# Patient Record
Sex: Male | Born: 1995 | Race: Black or African American | Hispanic: No | Smoking: Current every day smoker
Health system: Southern US, Community
[De-identification: ages and names within clinical notes are randomized; demographics above are authoritative.]

---
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
## 2015-03-19 ENCOUNTER — Encounter (HOSPITAL_COMMUNITY): Payer: Self-pay | Admitting: Emergency Medicine

## 2015-03-19 ENCOUNTER — Emergency Department (INDEPENDENT_AMBULATORY_CARE_PROVIDER_SITE_OTHER)
Admission: EM | Admit: 2015-03-19 | Discharge: 2015-03-19 | Disposition: A | Discharge status: Home / Self Care | Payer: Self-pay | Source: Home / Self Care | Attending: Family Medicine | Admitting: Family Medicine

## 2015-03-19 DIAGNOSIS — K2970 Gastritis, unspecified, without bleeding: Secondary | ICD-10-CM

## 2015-03-19 DIAGNOSIS — R1013 Epigastric pain: Secondary | ICD-10-CM

## 2015-03-19 MED ORDER — OMEPRAZOLE 40 MG PO CPDR: 40.0000 mg | DELAYED_RELEASE_CAPSULE | Freq: Every day | ORAL | Status: DC

## 2015-03-19 MED ORDER — GI COCKTAIL ~~LOC~~
ORAL | Status: AC
  Filled 2015-03-19: qty 30

## 2015-03-19 MED ORDER — SUCRALFATE 1 G PO TABS: 1.0000 g | ORAL_TABLET | Freq: Three times a day (TID) | ORAL | Status: DC

## 2015-03-19 MED ORDER — GI COCKTAIL ~~LOC~~
30.0000 mL | Freq: Once | ORAL | Status: AC
  Administered 2015-03-19: 30 mL via ORAL

## 2015-03-19 NOTE — ED Provider Notes (Signed)
Cory SicksGeorge Abernethy Huynh is a 19 y.o. male who presents to Urgent Care today for abdominal pain and vomiting present for one week. Symptoms worsened yesterday. Symptoms are consistent with prior episodes of gastritis. He denies any blood in the vomit. No diarrhea. No fevers or chills. No treatment tried yet.   History reviewed. No pertinent past medical history. History reviewed. No pertinent past surgical history. History  Substance Use Topics  . Smoking status: Current Every Day Smoker -- 1.00 packs/day    Types: Cigarettes  . Smokeless tobacco: Not on file  . Alcohol Use: No   ROS as above Medications: No current facility-administered medications for this encounter.   Current Outpatient Prescriptions  Medication Sig Dispense Refill  . benzonatate (TESSALON) 100 MG capsule Take 1 capsule (100 mg total) by mouth 2 (two) times daily as needed for cough. 20 capsule 0  . omeprazole (PRILOSEC) 40 MG capsule Take 1 capsule (40 mg total) by mouth daily. 30 capsule 1  . oseltamivir (TAMIFLU) 75 MG capsule Take 1 capsule (75 mg total) by mouth every 12 (twelve) hours. 10 capsule 0  . Phenylephrine-DM-GG-APAP (TYLENOL COLD/FLU SEVERE) 5-10-200-325 MG TABS Take by mouth.    . sucralfate (CARAFATE) 1 G tablet Take 1 tablet (1 g total) by mouth 4 (four) times daily -  with meals and at bedtime. 60 tablet 0   No Known Allergies   Exam:  BP 128/71 mmHg  Pulse 80  Temp(Src) 98.4 F (36.9 C) (Oral)  Resp 16  SpO2 99% Gen: Well NAD HEENT: EOMI,  MMM Lungs: Normal work of breathing. CTABL Heart: RRR no MRG Abd: NABS, Soft. Nondistended, mildly tender epigastric area no rebound or guarding Exts: Brisk capillary refill, warm and well perfused.   Patient was given a GI cocktail and felt better  No results found for this or any previous visit (from the past 24 hour(s)). No results found.  Assessment and Plan: 19 y.o. male with gastritis. Treat with omeprazole and Carafate. Follow-up with  PCP.  Discussed warning signs or symptoms. Please see discharge instructions. Patient expresses understanding.     Rodolph BongEvan S Corey, MD 03/19/15 [phone]

## 2015-03-19 NOTE — Discharge Instructions (Signed)
Thank you for coming in today. °If your belly pain worsens, or you have high fever, bad vomiting, blood in your stool or black tarry stool go to the Emergency Room.  ° °Gastritis, Adult °Gastritis is soreness and swelling (inflammation) of the lining of the stomach. Gastritis can develop as a sudden onset (acute) or long-term (chronic) condition. If gastritis is not treated, it can lead to stomach bleeding and ulcers. °CAUSES  °Gastritis occurs when the stomach lining is weak or damaged. Digestive juices from the stomach then inflame the weakened stomach lining. The stomach lining may be weak or damaged due to viral or bacterial infections. One common bacterial infection is the Helicobacter pylori infection. Gastritis can also result from excessive alcohol consumption, taking certain medicines, or having too much acid in the stomach.  °SYMPTOMS  °In some cases, there are no symptoms. When symptoms are present, they may include: °· Pain or a burning sensation in the upper abdomen. °· Nausea. °· Vomiting. °· An uncomfortable feeling of fullness after eating. °DIAGNOSIS  °Your caregiver may suspect you have gastritis based on your symptoms and a physical exam. To determine the cause of your gastritis, your caregiver may perform the following: °· Blood or stool tests to check for the H pylori bacterium. °· Gastroscopy. A thin, flexible tube (endoscope) is passed down the esophagus and into the stomach. The endoscope has a light and camera on the end. Your caregiver uses the endoscope to view the inside of the stomach. °· Taking a tissue sample (biopsy) from the stomach to examine under a microscope. °TREATMENT  °Depending on the cause of your gastritis, medicines may be prescribed. If you have a bacterial infection, such as an H pylori infection, antibiotics may be given. If your gastritis is caused by too much acid in the stomach, H2 blockers or antacids may be given. Your caregiver may recommend that you stop taking  aspirin, ibuprofen, or other nonsteroidal anti-inflammatory drugs (NSAIDs). °HOME CARE INSTRUCTIONS °· Only take over-the-counter or prescription medicines as directed by your caregiver. °· If you were given antibiotic medicines, take them as directed. Finish them even if you start to feel better. °· Drink enough fluids to keep your urine clear or pale yellow. °· Avoid foods and drinks that make your symptoms worse, such as: °¨ Caffeine or alcoholic drinks. °¨ Chocolate. °¨ Peppermint or mint flavorings. °¨ Garlic and onions. °¨ Spicy foods. °¨ Citrus fruits, such as oranges, lemons, or limes. °¨ Tomato-based foods such as sauce, chili, salsa, and pizza. °¨ Fried and fatty foods. °· Eat small, frequent meals instead of large meals. °SEEK IMMEDIATE MEDICAL CARE IF:  °· You have black or dark red stools. °· You vomit blood or material that looks like coffee grounds. °· You are unable to keep fluids down. °· Your abdominal pain gets worse. °· You have a fever. °· You do not feel better after 1 week. °· You have any other questions or concerns. °MAKE SURE YOU: °· Understand these instructions. °· Will watch your condition. °· Will get help right away if you are not doing well or get worse. °Document Released: 10/18/2001 Document Revised: 04/24/2012 Document Reviewed: 12/07/2011 °ExitCare® Patient Information ©2015 [employer] LLC. This information is not intended to replace advice given to you by your health care provider. Make sure you discuss any questions you have with your health care provider. ° °

## 2015-03-19 NOTE — ED Notes (Signed)
C/o abd pain States he has hx of stomach ulcers States he has been vomiting Denies any diarrhea

## 2015-04-08 ENCOUNTER — Encounter (HOSPITAL_COMMUNITY): Payer: Self-pay | Admitting: *Deleted

## 2015-04-08 ENCOUNTER — Emergency Department (HOSPITAL_COMMUNITY)
Admission: EM | Admit: 2015-04-08 | Discharge: 2015-04-08 | Disposition: A | Discharge status: Home / Self Care | Payer: Self-pay | Attending: Emergency Medicine | Admitting: Emergency Medicine

## 2015-04-08 DIAGNOSIS — L989 Disorder of the skin and subcutaneous tissue, unspecified: Secondary | ICD-10-CM | POA: Insufficient documentation

## 2015-04-08 DIAGNOSIS — L259 Unspecified contact dermatitis, unspecified cause: Secondary | ICD-10-CM | POA: Insufficient documentation

## 2015-04-08 DIAGNOSIS — R202 Paresthesia of skin: Secondary | ICD-10-CM | POA: Insufficient documentation

## 2015-04-08 DIAGNOSIS — Z79899 Other long term (current) drug therapy: Secondary | ICD-10-CM | POA: Insufficient documentation

## 2015-04-08 DIAGNOSIS — IMO0002 Reserved for concepts with insufficient information to code with codable children: Secondary | ICD-10-CM

## 2015-04-08 DIAGNOSIS — L139 Bullous disorder, unspecified: Secondary | ICD-10-CM | POA: Insufficient documentation

## 2015-04-08 DIAGNOSIS — Z720 Tobacco use: Secondary | ICD-10-CM | POA: Insufficient documentation

## 2015-04-08 MED ORDER — TRIAMCINOLONE ACETONIDE 0.1 % EX CREA: 1.0000 "application " | TOPICAL_CREAM | Freq: Three times a day (TID) | CUTANEOUS | Status: DC

## 2015-04-08 NOTE — ED Notes (Signed)
Provided teaching for dressing change and extra supplies.

## 2015-04-08 NOTE — ED Notes (Signed)
Pt in c/o rash and blisters to his right ankle and back of his foot, pt states it was itching yesterday but he didn't notice any redness, today has a fine red rash and blisters

## 2015-04-08 NOTE — Discharge Instructions (Signed)
Use triamcinolone lotion as prescribed. Wash the area well with warm soapy water 3 times daily. Monitor for signs of infection, including spreading redness, pus drainage, warmth, or fevers. Continue your usual home medications. Get plenty of rest and drink plenty of fluids. Avoid any known triggers. Keep area clean and dry, and protect it from your shoes. Clean your shoes out well. Please followup with [facility] in 1 week for recheck and to establish care, use the list below to find a regular doctor. Return to the ER for changes or worsening symptoms   Contact Dermatitis Contact dermatitis is a rash that happens when something touches the skin. You touched something that irritates your skin, or you have allergies to something you touched. HOME CARE   Avoid the thing that caused your rash.  Keep your rash away from hot water, soap, sunlight, chemicals, and other things that might bother it.  Do not scratch your rash.  You can take cool baths to help stop itching.  Only take medicine as told by your doctor.  Keep all doctor visits as told. GET HELP RIGHT AWAY IF:   Your rash is not better after 3 days.  Your rash gets worse.  Your rash is puffy (swollen), tender, red, sore, or warm.  You have problems with your medicine. MAKE SURE YOU:   Understand these instructions.  Will watch your condition.  Will get help right away if you are not doing well or get worse. Document Released: 08/21/2009 Document Revised: 01/16/2012 Document Reviewed: 03/29/2011 [facility] Patient Information 2015 [place], [place]. This information is not intended to replace advice given to you by your health care provider. Make sure you discuss any questions you have with your health care provider.  Rash A rash is a change in the color or texture of your skin. There are many different types of rashes. You may have other problems that accompany your rash. CAUSES   Infections.  Allergic  reactions. This can include allergies to pets or foods.  Certain medicines.  Exposure to certain chemicals, soaps, or cosmetics.  Heat.  Exposure to poisonous plants.  Tumors, both cancerous and noncancerous. SYMPTOMS   Redness.  Scaly skin.  Itchy skin.  Dry or cracked skin.  Bumps.  Blisters.  Pain. DIAGNOSIS  Your caregiver may do a physical exam to determine what type of rash you have. A skin sample (biopsy) may be taken and examined under a microscope. TREATMENT  Treatment depends on the type of rash you have. Your caregiver may prescribe certain medicines. For serious conditions, you may need to see a skin doctor (dermatologist). HOME CARE INSTRUCTIONS   Avoid the substance that caused your rash.  Do not scratch your rash. This can cause infection.  You may take cool baths to help stop itching.  Only take over-the-counter or prescription medicines as directed by your caregiver.  Keep all follow-up appointments as directed by your caregiver. SEEK IMMEDIATE MEDICAL CARE IF:  You have increasing pain, swelling, or redness.  You have a fever.  You have new or severe symptoms.  You have body aches, diarrhea, or vomiting.  Your rash is not better after 3 days. MAKE SURE YOU:  Understand these instructions.  Will watch your condition.  Will get help right away if you are not doing well or get worse. Document Released: 10/14/2002 Document Revised: 01/16/2012 Document Reviewed: 08/08/2011 [facility] Patient Information 2015 [place]Care, [place]. This information is not intended to replace advice given to you by your  health care provider. Make sure you discuss any questions you have with your health care provider.  Blisters Blisters are fluid-filled sacs that form within the skin. Common causes of blistering are friction, burns, and exposure to irritating chemicals. The fluid in the blister protects the underlying damaged skin. Most of the time it is not  recommended that you open blisters. When a blister is opened, there is an increased chance for infection. Usually, a blister will open on its own. They then dry up and peel off within 10 days. If the blister is tense and uncomfortable (painful) the fluid may be drained. If it is drained the roof of the blister should be left intact. The draining should only be done by a medical professional under aseptic conditions. Poorly fitting shoes and boots can cause blisters by being too tight or too loose. Wearing extra socks or using tape, bandages, or pads over the blister-prone area helps prevent the problem by reducing friction. Blisters heal more slowly if you have diabetes or if you have problems with your circulation. You need to be careful about medical follow-up to prevent infection. HOME CARE INSTRUCTIONS  Protect areas where blisters have formed until the skin is healed. Use a special bandage with a hole cut in the middle around the blister. This reduces pressure and friction. When the blister breaks, trim off the loose skin and keep the area clean by washing it with soap daily. Soaking the blister or broken-open blister with diluted vinegar twice daily for 15 minutes will dry it up and speed the healing. Use 3 tablespoons of white vinegar per quart of water (45 mL white vinegar per liter of water). An antibiotic ointment and a bandage can be used to cover the area after soaking.  SEEK MEDICAL CARE IF:   You develop increased redness, pain, swelling, or drainage in the blistered area.  You develop a pus-like discharge from the blistered area, chills, or a fever. MAKE SURE YOU:   Understand these instructions.  Will watch your condition.  Will get help right away if you are not doing well or get worse. Document Released: 12/01/2004 Document Revised: 01/16/2012 Document Reviewed: 10/29/2008 [facility] Patient Information 2015 [place], [place]. This information is not intended to replace advice given to  you by your health care provider. Make sure you discuss any questions you have with your health care provider. Emergency Department Resource Guide 1) Find a Doctor and Pay Out of Pocket Although you won't have to find out who is covered by your insurance plan, it is a good idea to ask around and get recommendations. You will then need to call the office and see if the doctor you have chosen will accept you as a new patient and what types of options they offer for patients who are self-pay. Some doctors offer discounts or will set up payment plans for their patients who do not have insurance, but you will need to ask so you aren't surprised when you get to your appointment.  2) Contact Your Local Health Department Not all health departments have doctors that can see patients for sick visits, but many do, so it is worth a call to see if yours does. If you don't know where your local health department is, you can check in your phone book. The CDC also has a tool to help you locate your state's health department, and many state websites also have listings of all of their local health departments.  3) Find a [employer]  If your illness is not likely to be very severe or complicated, you may want to try a walk in clinic. These are popping up all over the country in pharmacies, drugstores, and shopping centers. They're usually staffed by nurse practitioners or physician assistants that have been trained to treat common illnesses and complaints. They're usually fairly quick and inexpensive. However, if you have serious medical issues or chronic medical problems, these are probably not your best option.  No Primary Care Doctor: - Call Health Connect at  [phone] - they can help you locate a primary care doctor that  accepts your insurance, provides certain services, etc. - Physician Referral Service- [phone]  Chronic Pain Problems: Organization         Address  Phone   Notes  [facility]  [phone] Patients need to be referred by their primary care doctor.   Medication Assistance: Organization         Address  Phone   Notes  [facility] Medication [facility] [address] [place], [place] [address] [phone] --Must be a resident of [facility] -- Must have NO insurance coverage whatsoever (no Medicaid/ Medicare, etc.) -- The pt. MUST have a primary care doctor that directs their care regularly and follows them in the community   [facility]  [phone]   [employer]  [phone]    Agencies that provide inexpensive medical care: Organization         Address  Phone   Notes  [facility]  [phone]   [facility]    [phone]   [facility] [address] [place], [place] [address] [phone]   [facility] [address] [place] [phone]   Planned Parenthood    [phone]   [facility]    [phone]   [facility]  [address], [place] Phone:  [phone], Fax:  [phone] Hours of Operation:  9 am - 6 pm, M-F.  Also accepts Medicaid/Medicare and self-pay.  [facility]  [address], [place] Phone: [phone], Fax: [phone]. Hours of Operation:  8:30 am - 5:30 pm, M-F.  Also accepts Medicaid and self-pay.  [facility] [place] [address], [place] Phone: [phone]   [facility] [address] Natasha Bence [place], [place] [phone], Ext. [phone] Mondays & Thursdays: 7-9 AM.  First 15 patients are seen on a first come, first serve basis.    Medicaid-accepting [facility] Providers:  Organization         Address  Phone   Notes  [facility] [address], [place] [phone] Also accepts self-pay patients.  [facility] [address] Josephs  [place], [place]  [phone]   [facility] [address], [place] [phone]   [facility] [address], [place] [phone]   Renaye Rakers [address], [place]   [phone] Only accepts [place] patients after they have their name applied to their card.   Self-Pay (no insurance) in [facility]:  Organization         Address  Phone   Notes  Sickle Cell Patients, [facility] [address] [place], [place] [phone])  [phone]   [facility] [address] [place], [place] [phone]   [facility] [place]  [address], [address], [place] [phone]   [facility]/Dr. Osei-Bonsu  [address], [place] or [address], [place] [phone] Phone number for both [place] and [place] locations is the same.  [facility] and [facility] [address] [phone]   [facility] [address] [place] or [address] [phone]   [facility] [address] [place] [phone], phone; [phone], fax Sees patients 1st and 3rd Saturday of every month.  Must not qualify for public or private insurance (i.e. Medicaid, Medicare, [place] Health Choice, Veterans' Benefits)  Household income should be no more than 200% of the poverty level The clinic cannot treat you if you are pregnant or think you are pregnant  Sexually transmitted diseases are not treated at the clinic.

## 2015-04-08 NOTE — ED Provider Notes (Signed)
CSN: [id number]     Arrival date & time 04/08/15  1834 History  This chart was scribed for [employer], PA-C, working with Vanetta Mulders, MD by Chestine Spore, ED Scribe. The patient was seen in room TR11C/TR11C at 6:51 PM.    Chief Complaint  Patient presents with  . Rash     Patient is a 19 y.o. male presenting with rash. The history is provided by the patient. No language interpreter was used.  Rash Location:  Foot Foot rash location:  R ankle Quality: blistering, draining (clear yellow), itchiness, painful and redness   Pain details:    Quality:  Itching and sharp   Severity:  Moderate   Onset quality:  Sudden   Duration:  1 day   Timing:  Constant   Progression:  Worsening Severity:  Moderate Onset quality:  Sudden Duration:  1 day Timing:  Constant Progression:  Worsening Chronicity:  New Context: plant contact (neighbor cut his grass)   Context: not animal contact, not exposure to similar rash, not hot tub use, not insect bite/sting, not medications, not new detergent/soap and not sick contacts   Relieved by:  Nothing Worsened by:  Contact Ineffective treatments: bactine spray. Associated symptoms: no abdominal pain, no fever, no joint pain, no myalgias, no nausea, no shortness of breath, no sore throat, no throat swelling, no tongue swelling, no URI and not vomiting      Cory Huynh is a 19 y.o. male with no significant medical hx who presents to the Emergency department complaining of rash onset yesterday. Pt reports that the rash, which appears like small tense blisters, worsened today after he got off work at 4 PM. Pt repots pain to the rash on his right ankle which is 6-7/10, constant, sharp, and it does not radiate. He states that movement worsens his ankle pain. He states that he has tried [employer] spray and wrapping it with no relief for his symptoms. After the pt took the wrap off it was stained clear yellow. He states that he is having associated  symptoms of intermittent tingling in his toes, and redness.  He denies numbness, weakness, CP, SOB, abdominal pain, n/v, tongue/lip swelling, difficulty swallowing, fevers, chills, warmth, cold symptoms, and any other symptoms.  Denies insect bite/detergents/soaps/going into wooden areas/hot tubs use. Pt reports that they washed their dogs yesterday and cut the grass yesterday. Denies sick contacts/ similar rash.  History reviewed. No pertinent past medical history. History reviewed. No pertinent past surgical history. History reviewed. No pertinent family history. History  Substance Use Topics  . Smoking status: Current Every Day Smoker -- 1.00 packs/day    Types: Cigarettes  . Smokeless tobacco: Not on file  . Alcohol Use: No    Review of Systems  Constitutional: Negative for fever and chills.  HENT: Negative for congestion, rhinorrhea, sore throat and trouble swallowing.   Respiratory: Negative for shortness of breath.   Cardiovascular: Negative for chest pain.  Gastrointestinal: Negative for nausea, vomiting and abdominal pain.  Musculoskeletal: Negative for myalgias, joint swelling and arthralgias.  Skin: Positive for rash.  Allergic/Immunologic: Negative for immunocompromised state.  Neurological: Negative for weakness and numbness.       Right toes tingling   Hematological: Does not bruise/bleed easily.  A complete 10 system review of systems was obtained and all systems are negative except as noted in the HPI and PMH.      Allergies  Review of patient's allergies indicates no known allergies.  Home Medications  Prior to Admission medications   Medication Sig Start Date End Date Taking? Authorizing Provider  benzonatate (TESSALON) 100 MG capsule Take 1 capsule (100 mg total) by mouth 2 (two) times daily as needed for cough. 09/17/14   Charm RingsErin J Honig, MD  omeprazole (PRILOSEC) 40 MG capsule Take 1 capsule (40 mg total) by mouth daily. 03/19/15   Rodolph BongEvan S Corey, MD   oseltamivir (TAMIFLU) 75 MG capsule Take 1 capsule (75 mg total) by mouth every 12 (twelve) hours. 09/17/14   Charm RingsErin J Honig, MD  Phenylephrine-DM-GG-APAP (TYLENOL COLD/FLU SEVERE) 5-10-200-325 MG TABS Take by mouth.    Historical Provider, MD  sucralfate (CARAFATE) 1 G tablet Take 1 tablet (1 g total) by mouth 4 (four) times daily -  with meals and at bedtime. 03/19/15   Rodolph BongEvan S Corey, MD   BP 120/60 mmHg  Pulse 83  Temp(Src) 98.3 F (36.8 C) (Oral)  Resp 20  SpO2 98% Physical Exam  Constitutional: He is oriented to person, place, and time. Vital signs are normal. He appears well-developed and well-nourished.  Non-toxic appearance. No distress.  Afebrile, nontoxic, NAD  HENT:  Head: Normocephalic and atraumatic.  Mouth/Throat: Mucous membranes are normal.  Eyes: Conjunctivae and EOM are normal. Right eye exhibits no discharge. Left eye exhibits no discharge.  Neck: Normal range of motion. Neck supple.  Cardiovascular: Normal rate and intact distal pulses.   Pulmonary/Chest: Effort normal. No respiratory distress.  Abdominal: Normal appearance. He exhibits no distension.  Musculoskeletal: Normal range of motion.  Right ankle with FROM intact, no bony tenderness, no swelling or joint effusion, strength and sensation grossly intact. Distal pulses intact.  Neurological: He is alert and oriented to person, place, and time. He has normal strength. No sensory deficit.  Skin: Skin is warm, dry and intact. Rash noted. Rash is vesicular.  Right ankle rash with tense bulla and fine vesicular lesion with surrounding erythema located over the lateral malleolus and posterior ankle. See picture below.  Psychiatric: He has a normal mood and affect.  Nursing note and vitals reviewed.       ED Course  Procedures (including critical care time) DIAGNOSTIC STUDIES: Oxygen Saturation is 98% on RA, nl by my interpretation.    COORDINATION OF CARE: 6:59 PM-Discussed treatment plan which includes  consult with attending, referral to [place] [facility], and kenalog cream with pt at bedside and pt agreed to plan.   Labs Review Labs Reviewed - No data to display  Imaging Review No results found.   EKG Interpretation None      MDM   Final diagnoses:  Contact dermatitis  Blister of skin without infection  Bullous dermatitis    19 y.o. male here with blistering and somewhat vesicular rash to posterior R ankle. Recently had his grass cut, no other contact with animals/plants, no new meds, no other affected individuals. Appears somewhat like bullous pemphigoid vs contact dermatitis. No secondary infection. Will have pt start use of topical steroid, clean his boots out well, cover area well, and keep clean and dry bandage to help avoid any secondary infection. Will have him f/up with [facility] in 1wk for recheck and to establish care, but advised pt to return if area appears to become infected. I explained the diagnosis and have given explicit precautions to return to the ER including for any other new or worsening symptoms. The patient understands and accepts the medical plan as it's been dictated and I have answered their questions. Discharge instructions concerning home  care and prescriptions have been given. The patient is STABLE and is discharged to home in good condition.   I personally performed the services described in this documentation, which was scribed in my presence. The recorded information has been reviewed and is accurate.  BP 114/51 mmHg  Pulse 68  Temp(Src) 98.3 F (36.8 C) (Oral)  Resp 20  SpO2 100%  Meds ordered this encounter  Medications  . triamcinolone cream (KENALOG) 0.1 %    Sig: Apply 1 application topically 3 (three) times daily. X 14 days    Dispense:  30 g    Refill:  0    Order Specific Question:  Supervising Provider    Answer:  Eber Hong [[id number]]      Halvor Behrend Camprubi-Soms, PA-C 04/08/15 1943  Vanetta Mulders, MD 04/09/15 1544

## 2015-07-30 ENCOUNTER — Emergency Department (HOSPITAL_COMMUNITY)
Admission: EM | Admit: 2015-07-30 | Discharge: 2015-07-30 | Disposition: A | Discharge status: Home / Self Care | Payer: Self-pay | Attending: Emergency Medicine | Admitting: Emergency Medicine

## 2015-07-30 ENCOUNTER — Encounter (HOSPITAL_COMMUNITY): Payer: Self-pay | Admitting: *Deleted

## 2015-07-30 ENCOUNTER — Emergency Department (HOSPITAL_COMMUNITY): Payer: Self-pay

## 2015-07-30 DIAGNOSIS — R51 Headache: Secondary | ICD-10-CM | POA: Insufficient documentation

## 2015-07-30 DIAGNOSIS — R062 Wheezing: Secondary | ICD-10-CM | POA: Insufficient documentation

## 2015-07-30 DIAGNOSIS — R0602 Shortness of breath: Secondary | ICD-10-CM | POA: Insufficient documentation

## 2015-07-30 DIAGNOSIS — R079 Chest pain, unspecified: Secondary | ICD-10-CM | POA: Insufficient documentation

## 2015-07-30 DIAGNOSIS — R059 Cough, unspecified: Secondary | ICD-10-CM

## 2015-07-30 DIAGNOSIS — R05 Cough: Secondary | ICD-10-CM | POA: Insufficient documentation

## 2015-07-30 DIAGNOSIS — Z720 Tobacco use: Secondary | ICD-10-CM | POA: Insufficient documentation

## 2015-07-30 DIAGNOSIS — F17200 Nicotine dependence, unspecified, uncomplicated: Secondary | ICD-10-CM

## 2015-07-30 DIAGNOSIS — Z79899 Other long term (current) drug therapy: Secondary | ICD-10-CM | POA: Insufficient documentation

## 2015-07-30 NOTE — ED Notes (Signed)
Pt states cough for several days and sternal and lower rib pain when he coughs.  Denies fevers.  Smokes 1 pack per day.

## 2015-07-30 NOTE — Discharge Instructions (Signed)
You may take over the counter cough suppressant for your cough. I advise you to try to quit smoking as this will help with your cough and overall health.  Please follow up with a primary care provider from the Resource Guide provided below in 1 week. Please return to the Emergency Department if symptoms worsen or new onset of fever, chills, night sweats, weight loss, difficulty breathing, coughing up blood.   Emergency Department Resource Guide 1) Find a Doctor and Pay Out of Pocket Although you won't have to find out who is covered by your insurance plan, it is a good idea to ask around and get recommendations. You will then need to call the office and see if the doctor you have chosen will accept you as a new patient and what types of options they offer for patients who are self-pay. Some doctors offer discounts or will set up payment plans for their patients who do not have insurance, but you will need to ask so you aren't surprised when you get to your appointment.  2) Contact Your Local Health Department Not all health departments have doctors that can see patients for sick visits, but many do, so it is worth a call to see if yours does. If you don't know where your local health department is, you can check in your phone book. The CDC also has a tool to help you locate your state's health department, and many state websites also have listings of all of their local health departments.  3) Find a Walk-in Clinic If your illness is not likely to be very severe or complicated, you may want to try a walk in clinic. These are popping up all over the country in pharmacies, drugstores, and shopping centers. They're usually staffed by nurse practitioners or physician assistants that have been trained to treat common illnesses and complaints. They're usually fairly quick and inexpensive. However, if you have serious medical issues or chronic medical problems, these are probably not your best option.  No  Primary Care Doctor: - Call Health Connect at  [phone] - they can help you locate a primary care doctor that  accepts your insurance, provides certain services, etc. - Physician Referral Service- [phone]  Chronic Pain Problems: Organization         Address  Phone   Notes  [facility]  [phone] Patients need to be referred by their primary care doctor.   Medication Assistance: Organization         Address  Phone   Notes  [facility] Medication [facility] [address] [place] [place], [place] [address] [phone] --Must be a resident of [facility] -- Must have NO insurance coverage whatsoever (no Medicaid/ Medicare, etc.) -- The pt. MUST have a primary care doctor that directs their care regularly and follows them in the community   [facility]  [phone]   [employer]  [phone]    Agencies that provide inexpensive medical care: Organization         Address  Phone   Notes  [facility]  [phone]   [facility]    [phone]   [facility] [address] [place], [place] [address] [phone]   [facility] [address] [place] [place] [phone]   Planned Parenthood    [phone]   [facility]    [phone]   Community [facility] and  [address], [place] Phone:  [phone], Fax:  [phone] Hours of Operation:  9 am - 6 pm, M-F.  Also accepts Medicaid/Medicare and self-pay.  [facility] for [address], [address], [place] Phone: [phone], Fax: [phone]. Hours of Operation:  8:30 am - 5:30 pm, M-F.  Also accepts Medicaid and self-pay.  [facility] [place] [address] [place] Phone: [phone]   [place], [place], [place] [phone], Ext. [phone] Mondays & Thursdays: 7-9 AM.  First 15 patients are seen on  a first come, first serve basis.    [facility] Providers:  Organization         Address  Phone   Notes  [facility] [address], [place] [phone] Also accepts self-pay patients.  [facility] [address], [place]  [phone]   [place], [address], [place] [phone]   [facility] [address] [place] [phone]   Lucianne Lei [address], [place]   [phone] Only accepts [place] Access [place] patients after they have their name applied to their card.   Self-Pay (no insurance) in [facility]:  Organization         Address  Phone   Notes  Sickle Cell Patients, [facility] Internal Medicine [place] [phone]   [facility] [place] [phone]   [facility] [place]  [place], [place], [place] [phone]   [facility]/Dr. Osei-Bonsu  [address] [place] or [address], [place] [phone] Phone number for both [place] and [place] locations is the same.  Urgent Medical and [facility] [address] [place] [phone]   [facility] [address] [place] or [address] [phone]   [facility] [address] [place] [phone], phone; [phone], fax Sees patients 1st and 3rd Saturday of every month.  Must not qualify for public or private insurance (i.e. Medicaid, Medicare, [place] Health Choice, Veterans' Benefits)  Household income should be no more than 200% of the poverty level The clinic cannot treat you if you are pregnant or think you are pregnant  Sexually transmitted diseases are not treated at the clinic.    Dental Care: Organization          Address  Phone  Notes  [facility] Department of [facility] [place] [phone] Accepts children up to age 37 who are enrolled in [place] or [place]; pregnant women with a Medicaid card; and children who have applied for Medicaid or [place] Health Choice, but were declined, whose parents can pay a reduced fee at time of service.  [facility] Department of [facility]  [address], [place] [phone] Accepts children up to age 25 who are enrolled in [place] or [place]; pregnant women with a Medicaid card; and children who have applied for Medicaid or [place] Health Choice, but were declined, whose parents can pay a reduced fee at time of service.  [place] [facility] PRO[facility]  [place],  [place] [phone] Patients are seen by appointment only. Walk-ins are not accepted. [place] will see patients 31 years of age and older. Monday - Tuesday (8am-5pm) Most Wednesdays (8:30-5pm) $30 per visit, cash only  [facility] Adult Dental Access PROGRAM  [address], [facility] [phone] Patients are seen by appointment only. Walk-ins are not accepted. [place] will see patients 57 years of age and older. One Wednesday Evening (Monthly: Volunteer Based).  $30 per visit, cash only  [place]  [phone] for adults; Children under age 86, call Graduate Pediatric Dentistry at [phone]. Children aged 33-14, please call [phone] to request a pediatric application.  Dental services are provided in all areas of dental care including fillings, crowns and bridges, complete and partial dentures, implants, gum treatment, root canals, and extractions. Preventive care is also provided. Treatment is provided to both adults and children. Patients are selected via a lottery and there is often a waiting list.   [facility] [address] [place]  [phone] [email or web address]   [facility] [address] [place], [place] [phone], Ext. [phone] Second and Fourth Thursday of each month, opens at 6:30 AM; Clinic ends at 9 AM.  Patients are seen on a first-come first-served basis, and a limited number are seen during each clinic.   [facility]  [address] [place] [address] [place]ard Danker [place], [place] [phone]   Eligibility Requirements You must have lived in [place], [place], or [place] counties for at least the last three months.   You cannot be eligible for state or federal sponsored [employer], including [employer], [place], or [employer].   You generally cannot be eligible for healthcare insurance through your employer.    How to apply: Eligibility screenings are held every Tuesday and Wednesday afternoon from 1:00 pm until 4:00 pm. You do not need an appointment for the interview!  [facility] [address] [place], [place]   [place]  [place]  [phone]    Behavioral Health Resources in the Community: Intensive Outpatient Programs Organization         Address  Phone  Notes  [place]. [address] [place], [place] [phone]   [facility] Outpatient [address] [place], [place]   [facility]: [facility] [address] [place], [place]   [place] [address],  [place], [place] or [phone]   Substance Abuse Resources Organization         Address  Phone  Notes  Alcohol and Drug Services  [phone]   [place]  [phone]   [facility] [place]   Chinita Pester  [phone]   Residential & Outpatient Substance Abuse Program  [phone]   Psychological  Services Organization         Address  Phone  Notes  [facility] [place]  [place]  [phone]   [place] [address], [place] or [phone]    Mobile Crisis Teams Organization         Address  Phone  Notes  [facility], Mobile Crisis Care Unit  [phone]   [facility]  [address] [place] [address] [place], [place]   [place] [phone]  [address] [place] [phone]    Self-Help/Support Groups Organization         Address  Phone             Notes  [facility] of [place] - variety of support groups  336- I7437963 Call for more information  Narcotics Anonymous (NA), [facility] [address], [employer] [place]  2 meetings at this location   Statistician         Address  Phone  Notes  [facility] [address],    [place]  [phone]   [facility]  [address] [place] [address], [place], [place] [phone]   [facility] [address] [place], [place] [phone] Admissions: 8am-3pm M-F  [facility] [address],    [place], [place] [phone]   [facility] [address] [place], [place], [place] [phone]   [facility] [address],  [place], [place] [phone]   [facility] - Intensive Outpatient [address], [place], [place] [phone]   [facility] (Addiction [facility] [address] [place].,  [place], [place] [phone] or [phone]   [facility] ([facility]) [address], [place], [place] [phone] Accepts Medicaid  [facility] [address],  [place] [phone] Substance Abuse/Addiction Treatment   [facility] Organization         Address  Phone  Notes  [facility]  [phone]   Angie Fava, PhD [address] Ervin Knack [place], [place]   ([phone] or [phone]   [facility] Behavioral   [address] [place], [place] [phone]   [facility] [address] [place], [place] [phone] Insurance/Medicaid/sponsorship through [facility] and Families [address]                                    [place], [place] [phone] Therapy/tele-psych/case  [facility] [address], [place] [phone]    Dr. Lolly Mustache  [phone]   [facility] [place]  [employer] [facility] Dept. 1) [address], [place] 2) [address] [place] 3)  [address], [place] [phone]  [phone]   [facility] Child Abuse Hotline [phone] or 6712645094 (After Hours)

## 2015-07-30 NOTE — ED Provider Notes (Signed)
CSN: [id number]     Arrival date & time 07/30/15  0930 History   First MD Initiated Contact with Patient 07/30/15 1002     Chief Complaint  Patient presents with  . Cough     (Consider location/radiation/quality/duration/timing/severity/associated sxs/prior Treatment) HPI Comments: Pt is a 19 yo male who presents to the ED with complaint of cough, onset 2 months. Pt reports having a dry cough with 8-10 episodes of small amount of blood-tinged sputum over the course of the past 2 months. He also endorses sternal CP and pain to his lower ribs. Endorses SOB, wheezing. He notes he gets a HA that lasts appx. 53min-1hr after his coughing episodes. Denies fever, chills, night sweats, weight loss, abdominal pain, N/V/D, urinary sxs, numbness, tingling, weakness, recent travel. Pt reports smoking a little more than 1 PPD and states he started smoking when he was 19 yo.   Patient is a 19 y.o. male presenting with cough.  Cough Associated symptoms: chest pain, headaches, shortness of breath and wheezing     History reviewed. No pertinent past medical history. History reviewed. No pertinent past surgical history. No family history on file. Social History  Substance Use Topics  . Smoking status: Current Every Day Smoker -- 1.00 packs/day    Types: Cigarettes  . Smokeless tobacco: None  . Alcohol Use: No    Review of Systems  Respiratory: Positive for cough, shortness of breath and wheezing.   Cardiovascular: Positive for chest pain.  Neurological: Positive for headaches.  All other systems reviewed and are negative.     Allergies  Review of patient's allergies indicates no known allergies.  Home Medications   Prior to Admission medications   Medication Sig Start Date End Date Taking? Authorizing Provider  benzonatate (TESSALON) 100 MG capsule Take 1 capsule (100 mg total) by mouth 2 (two) times daily as needed for cough. 09/17/14   Charm Rings, MD  omeprazole (PRILOSEC) 40 MG capsule  Take 1 capsule (40 mg total) by mouth daily. 03/19/15   Rodolph Bong, MD  oseltamivir (TAMIFLU) 75 MG capsule Take 1 capsule (75 mg total) by mouth every 12 (twelve) hours. 09/17/14   Charm Rings, MD  Phenylephrine-DM-GG-APAP (TYLENOL COLD/FLU SEVERE) 5-10-200-325 MG TABS Take by mouth.    Historical Provider, MD  sucralfate (CARAFATE) 1 G tablet Take 1 tablet (1 g total) by mouth 4 (four) times daily -  with meals and at bedtime. 03/19/15   Rodolph Bong, MD  triamcinolone cream (KENALOG) 0.1 % Apply 1 application topically 3 (three) times daily. X 14 days 04/08/15   Mercedes Camprubi-Soms, PA-C   BP 123/68 mmHg  Pulse 61  Temp(Src) 98.7 F (37.1 C) (Oral)  Resp 16  Ht  (1.803 m)  Wt 160 lb (72.576 kg)  BMI 22.33 kg/m2  SpO2 98% Physical Exam  Constitutional: He is oriented to person, place, and time. He appears well-developed and well-nourished. No distress.  HENT:  Head: Normocephalic and atraumatic.  Right Ear: Tympanic membrane normal.  Left Ear: Tympanic membrane normal.  Nose: Nose normal. Right sinus exhibits no maxillary sinus tenderness and no frontal sinus tenderness. Left sinus exhibits no maxillary sinus tenderness and no frontal sinus tenderness.  Mouth/Throat: Uvula is midline, oropharynx is clear and moist and mucous membranes are normal. No oropharyngeal exudate.  Eyes: Conjunctivae and EOM are normal. Pupils are equal, round, and reactive to light. Right eye exhibits no discharge. Left eye exhibits no discharge. No scleral icterus.  Neck:  Normal range of motion. Neck supple.  Cardiovascular: Normal rate, regular rhythm, normal heart sounds and intact distal pulses.   Pulmonary/Chest: Effort normal and breath sounds normal. No respiratory distress. He has no wheezes. He has no rales. He exhibits tenderness (Mildly TTP at sternum and lower anterior ribs.).  Abdominal: Soft. Bowel sounds are normal. He exhibits no mass. There is no tenderness. There is no rebound and no  guarding.  Musculoskeletal: Normal range of motion. He exhibits no edema or tenderness.  Lymphadenopathy:    He has no cervical adenopathy.  Neurological: He is alert and oriented to person, place, and time. He has normal strength. No cranial nerve deficit or sensory deficit. Coordination normal.  Skin: Skin is warm and dry. He is not diaphoretic.  Nursing note and vitals reviewed.   ED Course  Procedures (including critical care time) Labs Review Labs Reviewed - No data to display  Imaging Review Dg Chest 2 View  07/30/2015   CLINICAL DATA:  Chest pain, productive cough.  EXAM: CHEST  2 VIEW  COMPARISON:  None.  FINDINGS: The heart size and mediastinal contours are within normal limits. Both lungs are clear. No pneumothorax or pleural effusion is noted. The visualized skeletal structures are unremarkable.  IMPRESSION: No active cardiopulmonary disease.   Electronically Signed   By: Lupita Raider, M.D.   On: 07/30/2015 10:39   I have personally reviewed and evaluated these images and lab results as part of my medical decision-making.   EKG Interpretation   Date/Time:  Thursday July 30 2015 09:36:40 EDT Ventricular Rate:  69 PR Interval:  112 QRS Duration: 98 QT Interval:  388 QTC Calculation: 415 R Axis:   86 Text Interpretation:  Normal sinus rhythm Normal ECG No prior for  comparison Confirmed by LIU MD, DANA ([id number]) on 07/30/2015 11:18:07 AM     Filed Vitals:   07/30/15 1130  BP: 128/73  Pulse: 57  Temp:   Resp: 16     MDM   Final diagnoses:  Cough  Smoker    Pt presents with cough and intermittent episodes of blood-tinged sputum over the past 2 months. He reports smoking more than 1 PPD, started smoking at 19 yo. Denies fever, chills, night sweats, recent travel, family history of lung CA. VSS. Lungs CTAB, no respiratory distress. Mild TTP at sternum and anterior lower chest wall, no abrasions or contusions noted on chest wall. CXR negative. EKG shows NSR.  I suspect cough and blood-tinged sputum is likely associated with smoking, I do not suspect infectious or malignant etiology at this time. Discussed with pt the importance of trying to quit smoking and the medical problems associated with smoking. Pt given Resource guide to follow up with PCP and a few strategies for quitting. Plan to d/c pt home and advised to take OTC cough suppressants for symptomatic relied and to f/u with PCP in 1 week.  Evaluation does not show pathology requring ongoing emergent intervention or admission. Pt is hemodynamically stable and mentating appropriately. Discussed findings/results and plan with patient/guardian, who agrees with plan. All questions answered. Return precautions discussed and outpatient follow up given.      Satira [address], [place] 07/30/15 1148  Lavera Guise, MD 07/30/15 [phone]

## 2017-07-31 ENCOUNTER — Emergency Department (HOSPITAL_COMMUNITY)
Admission: EM | Admit: 2017-07-31 | Discharge: 2017-07-31 | Disposition: A | Discharge status: Home / Self Care | Payer: Self-pay | Attending: Emergency Medicine | Admitting: Emergency Medicine

## 2017-07-31 ENCOUNTER — Encounter (HOSPITAL_COMMUNITY): Payer: Self-pay | Admitting: Emergency Medicine

## 2017-07-31 DIAGNOSIS — W57XXXA Bitten or stung by nonvenomous insect and other nonvenomous arthropods, initial encounter: Secondary | ICD-10-CM | POA: Insufficient documentation

## 2017-07-31 DIAGNOSIS — F17210 Nicotine dependence, cigarettes, uncomplicated: Secondary | ICD-10-CM | POA: Insufficient documentation

## 2017-07-31 DIAGNOSIS — Z23 Encounter for immunization: Secondary | ICD-10-CM | POA: Insufficient documentation

## 2017-07-31 DIAGNOSIS — R200 Anesthesia of skin: Secondary | ICD-10-CM | POA: Insufficient documentation

## 2017-07-31 DIAGNOSIS — Z79899 Other long term (current) drug therapy: Secondary | ICD-10-CM | POA: Insufficient documentation

## 2017-07-31 DIAGNOSIS — T7840XA Allergy, unspecified, initial encounter: Secondary | ICD-10-CM | POA: Insufficient documentation

## 2017-07-31 DIAGNOSIS — R2231 Localized swelling, mass and lump, right upper limb: Secondary | ICD-10-CM | POA: Insufficient documentation

## 2017-07-31 MED ORDER — DIPHENHYDRAMINE HCL 25 MG PO CAPS
25.0000 mg | ORAL_CAPSULE | Freq: Once | ORAL | Status: AC
  Administered 2017-07-31: 25 mg via ORAL
  Filled 2017-07-31: qty 1

## 2017-07-31 MED ORDER — EPINEPHRINE 0.3 MG/0.3ML IJ SOAJ: 0.3000 mg | Freq: Once | INTRAMUSCULAR | 0 refills | Status: AC

## 2017-07-31 MED ORDER — FAMOTIDINE 40 MG PO TABS: 40.0000 mg | ORAL_TABLET | Freq: Every day | ORAL | 0 refills | Status: DC

## 2017-07-31 MED ORDER — METHYLPREDNISOLONE SODIUM SUCC 125 MG IJ SOLR
125.0000 mg | Freq: Once | INTRAMUSCULAR | Status: AC
  Administered 2017-07-31: 125 mg via INTRAMUSCULAR
  Filled 2017-07-31: qty 2

## 2017-07-31 MED ORDER — DIPHENHYDRAMINE HCL 25 MG PO CAPS: 25.0000 mg | ORAL_CAPSULE | Freq: Four times a day (QID) | ORAL | 0 refills | Status: DC | PRN

## 2017-07-31 MED ORDER — TETANUS-DIPHTH-ACELL PERTUSSIS 5-2.5-18.5 LF-MCG/0.5 IM SUSP
0.5000 mL | Freq: Once | INTRAMUSCULAR | Status: AC
  Administered 2017-07-31: 0.5 mL via INTRAMUSCULAR
  Filled 2017-07-31: qty 0.5

## 2017-07-31 MED ORDER — FAMOTIDINE 20 MG PO TABS
40.0000 mg | ORAL_TABLET | Freq: Once | ORAL | Status: AC
  Administered 2017-07-31: 40 mg via ORAL
  Filled 2017-07-31: qty 2

## 2017-07-31 NOTE — ED Notes (Signed)
Pt reports being stung by yellow jackets yesterday while doing yard work.

## 2017-07-31 NOTE — ED Notes (Signed)
ED Provider at bedside. 

## 2017-07-31 NOTE — ED Triage Notes (Signed)
Pt to ER c/o yellow jacket stings last night to right arm, left neck, back, and left leg. Airway intact at triage, NAD.

## 2017-07-31 NOTE — Discharge Instructions (Signed)
Take Benadryl as directed.  Take Pepcid as directed.  Make sure you are applying ice to affected area, keeping it elevated, keeping it compressed.  Follow-up with one of the referred primary care doctors for further evaluation.  Return the emergency Department for any worsening pain/swelling of the arm, redness or swelling that extends down the hand or upper arm, discoloration of the fingers, chest pain, difficulty breathing, throat closing, vomiting or any other worsening or concerning symptoms.

## 2017-07-31 NOTE — ED Provider Notes (Signed)
[facility]-EMERGENCY DEPT Provider Note   CSN: [id number] Arrival date & time: 07/31/17  0907     History   Chief Complaint Chief Complaint  Patient presents with  . Allergic Reaction    HPI Cory Huynh is a 21 y.o. male presents with possible allergic reaction that occurred yesterday. Patient reports he was bitten by a yellow jacket on his right arm at 9 PM last night. 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 Patient states that he does not have an EpiPen because it is on back order. Patient reports that he did not take any rotation advancement. Patient states that he felt like his throat was closing up, swelling to the right arm, redness to the right arm. Patient reports that he was able to eat dinner and drink water after the incident. Patient reports that he woke up this morning with worsening symptoms, prompting ED visit. Patient reports that the throat symptoms have improved since this morning. He still expressing pain/numbness in the right arm. She denies any fever, chills, chest pain, difficulty breathing, lip or tongue swelling, abdominal pain, nausea/vomiting.  The history is provided by the patient.    History reviewed. No pertinent past medical history.  There are no active problems to display for this patient.   History reviewed. No pertinent surgical history.     Home Medications    Prior to Admission medications   Medication Sig Start Date End Date Taking? Authorizing Provider  benzonatate (TESSALON) 100 MG capsule Take 1 capsule (100 mg total) by mouth 2 (two) times daily as needed for cough. 09/17/14   Charm Rings, MD  diphenhydrAMINE (BENADRYL) 25 mg capsule Take 1 capsule (25 mg total) by mouth every 6 (six) hours as needed. 07/31/17   Maxwell Caul, PA-C  EPINEPHrine 0.3 mg/0.3 mL IJ SOAJ injection Inject 0.3 mLs (0.3 mg total) into the muscle once. 07/31/17 07/31/17  Maxwell Caul, PA-C  famotidine (PEPCID) 40 MG tablet Take  1 tablet (40 mg total) by mouth daily. 07/31/17 08/05/17  Maxwell Caul, PA-C  omeprazole (PRILOSEC) 40 MG capsule Take 1 capsule (40 mg total) by mouth daily. 03/19/15   Rodolph Bong, MD  oseltamivir (TAMIFLU) 75 MG capsule Take 1 capsule (75 mg total) by mouth every 12 (twelve) hours. 09/17/14   Charm Rings, MD  Phenylephrine-DM-GG-APAP (TYLENOL COLD/FLU SEVERE) 5-10-200-325 MG TABS Take by mouth.    [provider]  sucralfate (CARAFATE) 1 G tablet Take 1 tablet (1 g total) by mouth 4 (four) times daily -  with meals and at bedtime. 03/19/15   Rodolph Bong, MD  triamcinolone cream (KENALOG) 0.1 % Apply 1 application topically 3 (three) times daily. X 14 days 04/08/15   Street, [place], [place]    Family History History reviewed. No pertinent family history.  Social History Social History  Substance Use Topics  . Smoking status: Current Every Day Smoker    Packs/day: 1.00    Types: Cigarettes  . Smokeless tobacco: Never Used  . Alcohol use No     Allergies   Patient has no known allergies.   Review of Systems Review of Systems  Constitutional: Negative for fever.  HENT: Negative for trouble swallowing.   Respiratory: Negative for shortness of breath.   Cardiovascular: Negative for chest pain.  Gastrointestinal: Negative for abdominal pain, nausea and vomiting.  Skin: Positive for color change.  Neurological: Positive for numbness.     Physical Exam Updated Vital  Signs BP 128/66 (BP Location: Left Arm)   Pulse 72   Temp 98.5 F (36.9 C) (Oral)   Resp 16   SpO2 100%   Physical Exam  Constitutional: He is oriented to person, place, and time. He appears well-developed and well-nourished.  Sitting comfortably on examination table  HENT:  Head: Normocephalic and atraumatic.  Mouth/Throat: Uvula is midline, oropharynx is clear and moist and mucous membranes are normal.  No oral angioedema. Phonation is normal. Airway is intact.  Eyes: Pupils are equal, round,  and reactive to light. Conjunctivae, EOM and lids are normal.  Neck: Full passive range of motion without pain.  Cardiovascular: Normal rate, regular rhythm, normal heart sounds and normal pulses.  Exam reveals no gallop and no friction rub.   No murmur heard. Pulses:      Radial pulses are 2+ on the right side, and 2+ on the left side.  Pulmonary/Chest: Effort normal and breath sounds normal.  No evidence of respiratory distress. Able to speak in full sentences without difficulty.  Abdominal: Soft. Normal appearance. There is no tenderness. There is no rigidity and no guarding.  Musculoskeletal: Normal range of motion.  Tenderness palpation to the anterior aspect of the distal forearm with mild overlying edema and erythema. It shouldn't/extension of right upper extremity intact without difficulty. Full range of motion of right wrist intact without difficulty. Patient is able make a fist without any difficulty.  Neurological: He is alert and oriented to person, place, and time.  Patient reports slightly diminished sensation to the right hand. Follows commands, Moves all extremities  5/5 strength to BUE and BLE   Skin: Skin is warm and dry. Capillary refill takes less than 2 seconds.  Small insect bite noted to the anterior aspect of the right arm with surrounding erythema and edema. Cap refill normal. Hand is not dusky in appearance or cool to touch.  Psychiatric: He has a normal mood and affect. His speech is normal.  Nursing note and vitals reviewed.    ED Treatments / Results  Labs (all labs ordered are listed, but only abnormal results are displayed) Labs Reviewed - No data to display  EKG  EKG Interpretation None       Radiology No results found.  Procedures Procedures (including critical care time)  Medications Ordered in ED Medications  diphenhydrAMINE (BENADRYL) capsule 25 mg (25 mg Oral Given 07/31/17 1209)  famotidine (PEPCID) tablet 40 mg (40 mg Oral Given 07/31/17  1209)  methylPREDNISolone sodium succinate (SOLU-MEDROL) 125 mg/2 mL injection 125 mg (125 mg Intramuscular Given 07/31/17 1211)  Tdap (BOOSTRIX) injection 0.5 mL (0.5 mLs Intramuscular Given 07/31/17 1210)     Initial Impression / Assessment and Plan / ED Course  I have reviewed the triage vital signs and the nursing notes.  Pertinent labs & imaging results that were available during my care of the patient were reviewed by me and considered in my medical decision making (see chart for details).     21 year old male with known allergy to yellow jackets reports being bitten by a yellowjacket at approximately 9 PM last night. Had some symptoms last night but was able to eat and drink without any difficulty. Patient states that he woke up this morning and had some swelling and redness and pain to the right arm and some feeling of throat swelling. The swelling has resolved. Patient is afebrile, non-toxic appearing, sitting comfortably on examination table. Vital signs reviewed and stable. No evidence of respiratory distress. Right arm  shows evidence of insect bite with overlying swelling and erythema. Patient reports decreased sensation but is able to feel sharp pinpoint. Likely result of allergic reaction. Will plan to give analgesics here in the department. Plan to give IVF for fluid resuscitation. Tetanus will be updated. Do not suspect DVT or cellulitis of the upper extremity at this time.  Reevaluation after medications given. Patient has been able to drink the department without any difficulty. He has had no difficulty swallowing as had no vomiting since being in the department. RUE still slightly swollen and painful but is improved after ice application. He has good pulses and cap refill. Vital signs are stable. Lungs CTAB. Patient's O2 sat is 100% on room air. Airway is intact. Patient is normal. Patient is stable for discharge at this time. Will plan to send him home with a prescription to continue  taking Benadryl and Pepcid for symptomatically relief. Provided patient with a list of clinic resources to use if he does not have a PCP. Instructed to call them today to arrange follow-up in the next 24-48 hours. Strict return precautions discussed. Patient expresses understanding and agreement to plan.   Final Clinical Impressions(s) / ED Diagnoses   Final diagnoses:  Allergic reaction, initial encounter    New Prescriptions New Prescriptions   DIPHENHYDRAMINE (BENADRYL) 25 MG CAPSULE    Take 1 capsule (25 mg total) by mouth every 6 (six) hours as needed.   EPINEPHRINE 0.3 MG/0.3 ML IJ SOAJ INJECTION    Inject 0.3 mLs (0.3 mg total) into the muscle once.   FAMOTIDINE (PEPCID) 40 MG TABLET    Take 1 tablet (40 mg total) by mouth daily.     Maxwell Caul, PA-C 07/31/17 2050    Gwyneth Sprout, MD 08/02/17 [phone]

## 2018-04-09 DIAGNOSIS — J209 Acute bronchitis, unspecified: Secondary | ICD-10-CM | POA: Diagnosis not present

## 2018-04-09 DIAGNOSIS — R109 Unspecified abdominal pain: Secondary | ICD-10-CM | POA: Diagnosis not present

## 2018-10-17 DIAGNOSIS — R1013 Epigastric pain: Secondary | ICD-10-CM | POA: Diagnosis not present

## 2018-10-17 DIAGNOSIS — L0291 Cutaneous abscess, unspecified: Secondary | ICD-10-CM | POA: Diagnosis not present

## 2018-10-17 DIAGNOSIS — L723 Sebaceous cyst: Secondary | ICD-10-CM | POA: Diagnosis not present

## 2018-11-22 DIAGNOSIS — K921 Melena: Secondary | ICD-10-CM | POA: Diagnosis not present

## 2018-11-22 DIAGNOSIS — R1013 Epigastric pain: Secondary | ICD-10-CM | POA: Diagnosis not present

## 2018-11-22 DIAGNOSIS — R1115 Cyclical vomiting syndrome unrelated to migraine: Secondary | ICD-10-CM | POA: Diagnosis not present

## 2018-11-26 DIAGNOSIS — K259 Gastric ulcer, unspecified as acute or chronic, without hemorrhage or perforation: Secondary | ICD-10-CM | POA: Diagnosis not present

## 2018-11-29 ENCOUNTER — Ambulatory Visit (HOSPITAL_COMMUNITY)
Admission: EM | Admit: 2018-11-29 | Discharge: 2018-11-29 | Disposition: A | Discharge status: Home / Self Care | Payer: BLUE CROSS/BLUE SHIELD | Attending: Family Medicine | Admitting: Family Medicine

## 2018-11-29 ENCOUNTER — Other Ambulatory Visit: Payer: Self-pay

## 2018-11-29 ENCOUNTER — Encounter (HOSPITAL_COMMUNITY): Payer: Self-pay | Admitting: Emergency Medicine

## 2018-11-29 DIAGNOSIS — J029 Acute pharyngitis, unspecified: Secondary | ICD-10-CM | POA: Insufficient documentation

## 2018-11-29 DIAGNOSIS — R509 Fever, unspecified: Secondary | ICD-10-CM | POA: Diagnosis not present

## 2018-11-29 LAB — POCT RAPID STREP A: Streptococcus, Group A Screen (Direct): NEGATIVE

## 2018-11-29 MED ORDER — ACETAMINOPHEN 325 MG PO TABS
650.0000 mg | ORAL_TABLET | Freq: Once | ORAL | Status: AC
  Administered 2018-11-29: 650 mg via ORAL

## 2018-11-29 MED ORDER — ACETAMINOPHEN 325 MG PO TABS
ORAL_TABLET | ORAL | Status: AC
  Filled 2018-11-29: qty 2

## 2018-11-29 MED ORDER — AMOXICILLIN-POT CLAVULANATE 875-125 MG PO TABS: 1.0000 | ORAL_TABLET | Freq: Two times a day (BID) | ORAL | 0 refills | Status: DC

## 2018-11-29 MED ORDER — HYDROCODONE-ACETAMINOPHEN 5-325 MG PO TABS: 1.0000 | ORAL_TABLET | Freq: Four times a day (QID) | ORAL | 0 refills | Status: DC | PRN

## 2018-11-29 NOTE — Discharge Instructions (Signed)
Be aware, pain medications may cause drowsiness. Please do not drive, operate heavy machinery or make important decisions while on this medication, it can cloud your judgement.  

## 2018-11-29 NOTE — ED Triage Notes (Signed)
PT reports sore throat for 3 days.

## 2018-11-29 NOTE — ED Provider Notes (Signed)
[facility]   [id number] 11/29/18 Arrival Time: 1108  ASSESSMENT & PLAN:  1. Acute pharyngitis, unspecified etiology    No obvious sign of peritonsillar abscess this morning. Discussed.  Given exam and fever will treat empirically with: Meds ordered this encounter  Medications  . acetaminophen (TYLENOL) tablet 650 mg  . amoxicillin-clavulanate (AUGMENTIN) 875-125 MG tablet    Sig: Take 1 tablet by mouth every 12 (twelve) hours.    Dispense:  20 tablet    Refill:  0  . HYDROcodone-acetaminophen (NORCO/VICODIN) 5-325 MG tablet    Sig: Take 1 tablet by mouth every 6 (six) hours as needed for severe pain.    Dispense:  4 tablet    Refill:  0   Strict return precautions given, esp if his throat pain worsens, fevers persist, or if he experiences any trouble swallowing within the next 24-48 hours.   Results for orders placed or performed during the hospital encounter of 11/29/18  POCT rapid strep A [facility])  Result Value Ref Range   Streptococcus, Group A Screen (Direct) NEGATIVE NEGATIVE   Labs Reviewed  CULTURE, GROUP A STREP [facility])  POCT RAPID STREP A    OTC analgesics and throat care as needed  Instructed to finish full 10 day course of antibiotics. Will follow up if not showing significant improvement over the next 24-48 hours.  Work note given.  [place] Controlled Substances Registry consulted for this patient. I feel the risk/benefit ratio today is favorable for proceeding with this prescription for a controlled substance. Medication sedation precautions given.   Discharge Instructions     Be aware, pain medications may cause drowsiness. Please do not drive, operate heavy machinery or make important decisions while on this medication, it can cloud your judgement.   Reviewed expectations re: course of current medical issues. Questions answered. Outlined signs and symptoms indicating need for more acute intervention. Patient verbalized  understanding. After Visit Summary given.   SUBJECTIVE: History from: patient.  Cory Huynh is a 23 y.o. male who reports a L-sided sore throat. Describes as pain with swallowing and at rest. Onset abrupt beginning 2-3 days ago. Feeling normal before onset of current symptoms. No respiratory symptoms. Tolerating PO solids and liquids but reports significant discomfort with swallowing. Fever reported: yes, up to 103 degrees F last evening. "Feel a lump on my neck also". L-sided; same duration. No specific neck pain. No associated n/v/abdominal symptoms. Sick contacts: none known. No voice change reported. No h/o recurrent throat infections. No recent travel.  OTC treatment: OTC analgesics without pain relief.  ROS: As per HPI. All other systems negative    OBJECTIVE:  Vitals:   11/29/18 1221  BP: (!) 132/96  Pulse: 87  Resp: 16  Temp: (!) 101.8 F (38.8 C)  TempSrc: Oral  SpO2: 100%    General appearance: alert; no distress; appears fatigued HEENT: throat with tonsillar hypertrophy, marked erythema and exudates present (L>>R); uvula midline: yes; no drooling Neck: supple with FROM; L-sided cervical LAD that is tender to touch CV: RRR Lungs: clear to auscultation bilaterally; able to speak in full sentences Abd: soft; non-tender Skin: reveals no rash; warm and dry Psychological: alert and cooperative; normal mood and affect  No Known Allergies  PMH: Gastritis.  Social History   Socioeconomic History  . Marital status: Single    Spouse name: Not on file  . Number of children: Not on file  . Years of education: Not on file  . Highest education  level: Not on file  Occupational History  . Not on file  Social Needs  . Financial resource strain: Not on file  . Food insecurity:    Worry: Not on file    Inability: Not on file  . Transportation needs:    Medical: Not on file    Non-medical: Not on file  Tobacco Use  . Smoking status: Current Every Day Smoker     Packs/day: 1.00    Types: Cigarettes  . Smokeless tobacco: Never Used  Substance and Sexual Activity  . Alcohol use: No  . Drug use: No  . Sexual activity: Not on file  Lifestyle  . Physical activity:    Days per week: Not on file    Minutes per session: Not on file  . Stress: Not on file  Relationships  . Social connections:    Talks on phone: Not on file    Gets together: Not on file    Attends religious service: Not on file    Active member of club or organization: Not on file    Attends meetings of clubs or organizations: Not on file    Relationship status: Not on file  . Intimate partner violence:    Fear of current or ex partner: Not on file    Emotionally abused: Not on file    Physically abused: Not on file    Forced sexual activity: Not on file  Other Topics Concern  . Not on file  Social History Narrative  . Not on file   FH: HTN.        Mardella LaymanHagler, [name], MD 11/29/18 1310

## 2018-12-01 ENCOUNTER — Emergency Department (HOSPITAL_COMMUNITY): Payer: BLUE CROSS/BLUE SHIELD

## 2018-12-01 ENCOUNTER — Encounter (HOSPITAL_COMMUNITY): Payer: Self-pay | Admitting: Emergency Medicine

## 2018-12-01 ENCOUNTER — Emergency Department (HOSPITAL_COMMUNITY)
Admission: EM | Admit: 2018-12-01 | Discharge: 2018-12-01 | Disposition: A | Discharge status: Home / Self Care | Payer: BLUE CROSS/BLUE SHIELD | Attending: Emergency Medicine | Admitting: Emergency Medicine

## 2018-12-01 ENCOUNTER — Other Ambulatory Visit: Payer: Self-pay

## 2018-12-01 DIAGNOSIS — Z79899 Other long term (current) drug therapy: Secondary | ICD-10-CM | POA: Insufficient documentation

## 2018-12-01 DIAGNOSIS — M791 Myalgia, unspecified site: Secondary | ICD-10-CM | POA: Diagnosis not present

## 2018-12-01 DIAGNOSIS — J029 Acute pharyngitis, unspecified: Secondary | ICD-10-CM | POA: Insufficient documentation

## 2018-12-01 DIAGNOSIS — R51 Headache: Secondary | ICD-10-CM | POA: Diagnosis not present

## 2018-12-01 DIAGNOSIS — R509 Fever, unspecified: Secondary | ICD-10-CM | POA: Diagnosis not present

## 2018-12-01 DIAGNOSIS — F17210 Nicotine dependence, cigarettes, uncomplicated: Secondary | ICD-10-CM | POA: Diagnosis not present

## 2018-12-01 LAB — COMPREHENSIVE METABOLIC PANEL
ALT: 34 U/L (ref 0–44)
AST: 42 U/L — ABNORMAL HIGH (ref 15–41)
Albumin: 3.8 g/dL (ref 3.5–5.0)
Alkaline Phosphatase: 52 U/L (ref 38–126)
Anion gap: 15 (ref 5–15)
BUN: 14 mg/dL (ref 6–20)
CO2: 22 mmol/L (ref 22–32)
Calcium: 8.8 mg/dL — ABNORMAL LOW (ref 8.9–10.3)
Chloride: 99 mmol/L (ref 98–111)
Creatinine, Ser: 1.24 mg/dL (ref 0.61–1.24)
GFR calc Af Amer: >60 mL/min (ref >60)
GFR calc non Af Amer: >60 mL/min (ref >60)
Glucose, Bld: 123 mg/dL — ABNORMAL HIGH (ref 70–99)
Potassium: 3.6 mmol/L (ref 3.5–5.1)
Sodium: 136 mmol/L (ref 135–145)
Total Bilirubin: 0.4 mg/dL (ref 0.3–1.2)
Total Protein: 7 g/dL (ref 6.5–8.1)

## 2018-12-01 LAB — CBC WITH DIFFERENTIAL/PLATELET
Abs Immature Granulocytes: 0.05 10*3/uL (ref 0.00–0.07)
Basophils Absolute: 0 10*3/uL (ref 0.0–0.1)
Basophils Relative: 0 %
EOS ABS: 0 10*3/uL (ref 0.0–0.5)
Eosinophils Relative: 0 %
HEMATOCRIT: 44.3 % (ref 39.0–52.0)
Hemoglobin: 13.9 g/dL (ref 13.0–17.0)
IMMATURE GRANULOCYTES: 1 %
LYMPHS ABS: 1.7 10*3/uL (ref 0.7–4.0)
Lymphocytes Relative: 19 %
MCH: 26.5 pg (ref 26.0–34.0)
MCHC: 31.4 g/dL (ref 30.0–36.0)
MCV: 84.5 fL (ref 80.0–100.0)
MONOS PCT: 10 %
Monocytes Absolute: 0.9 10*3/uL (ref 0.1–1.0)
NEUTROS PCT: 70 %
NRBC: 0 % (ref 0.0–0.2)
Neutro Abs: 6.5 10*3/uL (ref 1.7–7.7)
Platelets: 240 10*3/uL (ref 150–400)
RBC: 5.24 MIL/uL (ref 4.22–5.81)
RDW: 12.7 % (ref 11.5–15.5)
WBC: 9.2 10*3/uL (ref 4.0–10.5)

## 2018-12-01 LAB — LACTIC ACID, PLASMA
Lactic Acid, Venous: 1.1 mmol/L (ref 0.5–1.9)
Lactic Acid, Venous: 0.9 mmol/L (ref 0.5–1.9)

## 2018-12-01 MED ORDER — HYDROCODONE-ACETAMINOPHEN 5-325 MG PO TABS: 1.0000 | ORAL_TABLET | Freq: Three times a day (TID) | ORAL | 0 refills | Status: AC | PRN

## 2018-12-01 MED ORDER — CLINDAMYCIN PHOSPHATE 600 MG/50ML IV SOLN
600.0000 mg | Freq: Once | INTRAVENOUS | Status: DC
  Administered 2018-12-01: 600 mg via INTRAVENOUS
  Filled 2018-12-01: qty 50

## 2018-12-01 MED ORDER — SODIUM CHLORIDE 0.9 % IV BOLUS
500.0000 mL | Freq: Once | INTRAVENOUS | Status: AC
  Administered 2018-12-01: 500 mL via INTRAVENOUS

## 2018-12-01 MED ORDER — IBUPROFEN 400 MG PO TABS
600.0000 mg | ORAL_TABLET | Freq: Once | ORAL | Status: AC
  Administered 2018-12-01: 600 mg via ORAL
  Filled 2018-12-01: qty 1

## 2018-12-01 MED ORDER — ACETAMINOPHEN 325 MG PO TABS
650.0000 mg | ORAL_TABLET | Freq: Once | ORAL | Status: AC | PRN
  Administered 2018-12-01: 650 mg via ORAL

## 2018-12-01 MED ORDER — SODIUM CHLORIDE 0.9% FLUSH
3.0000 mL | Freq: Once | INTRAVENOUS | Status: AC
  Administered 2018-12-01: 3 mL via INTRAVENOUS

## 2018-12-01 MED ORDER — SODIUM CHLORIDE 0.9 % IV SOLN
3.0000 g | Freq: Once | INTRAVENOUS | Status: AC
  Administered 2018-12-01: 3 g via INTRAVENOUS
  Filled 2018-12-01: qty 3

## 2018-12-01 MED ORDER — DEXAMETHASONE SODIUM PHOSPHATE 10 MG/ML IJ SOLN
10.0000 mg | Freq: Once | INTRAMUSCULAR | Status: AC
  Administered 2018-12-01: 10 mg via INTRAVENOUS
  Filled 2018-12-01: qty 1

## 2018-12-01 NOTE — ED Triage Notes (Signed)
Pt reports a fever for the past 5 days of up to 104.8. Pt reports going to UC but not being strep throat. UC advised pt he might have a peritonsilar abscess.

## 2018-12-01 NOTE — Discharge Instructions (Addendum)
Please continue taking the antibiotics as discussed.  Please call the ear nose and throat office tomorrow to schedule appointment for follow-up.  Monitor symptoms closely, if you continue to fevers in the next 24 to 48 hours he should return to the ER for follow-up.  If you have any difficulty swallowing, worsening pain in your throat, difficulty swallowing/opening your mouth or difficulty tolerating your own saliva than you should return to the emergency department immediately.

## 2018-12-01 NOTE — ED Provider Notes (Signed)
[facility] EMERGENCY DEPARTMENT Provider Note   CSN: [id number] Arrival date & time: 12/01/18  0037     History   Chief Complaint Chief Complaint  Patient presents with  . Fever  . Sore Throat  . Abscess    Tonsilar     HPI Cory Huynh is a 23 y.o. male.  HPI   Pt is a 23 y/o male with no PMHx who presents to the ED today for evaluation of sore throat that began 5 days ago. Pain is constant. Worse when swallowing or eating. Pain is severe. Has tried norco without much relief. He has had decreased appetite, intermittent headaches, myalgias, fevers, and chills for the same time period. He denies significant rhinorrhea, congestion, or cough.   He was seen at urgent care on 11/29/17 and at that time had no evidence of PTA on exam. He had negative strep testing, however was started on augmentin given that he was febrile.  Pt was started on augmentin on 11/29/17 and has had 4 doses total. He was advised to come to the ED if fevers persist past 24-48 hours.   History reviewed. No pertinent past medical history.  There are no active problems to display for this patient.   No past surgical history on file.      Home Medications    Prior to Admission medications   Medication Sig Start Date End Date Taking? Authorizing Provider  amoxicillin-clavulanate (AUGMENTIN) 875-125 MG tablet Take 1 tablet by mouth every 12 (twelve) hours. 11/29/18   Mardella Layman, MD  benzonatate (TESSALON) 100 MG capsule Take 1 capsule (100 mg total) by mouth 2 (two) times daily as needed for cough. 09/17/14   Charm Rings, MD  diphenhydrAMINE (BENADRYL) 25 mg capsule Take 1 capsule (25 mg total) by mouth every 6 (six) hours as needed. 07/31/17   Maxwell Caul, PA-C  famotidine (PEPCID) 40 MG tablet Take 1 tablet (40 mg total) by mouth daily. 07/31/17 08/05/17  Maxwell Caul, PA-C  HYDROcodone-acetaminophen (NORCO/VICODIN) 5-325 MG tablet Take 1 tablet by mouth every 8  (eight) hours as needed. 12/01/18   Carianne Taira S, PA-C  omeprazole (PRILOSEC) 40 MG capsule Take 1 capsule (40 mg total) by mouth daily. 03/19/15   Rodolph Bong, MD  oseltamivir (TAMIFLU) 75 MG capsule Take 1 capsule (75 mg total) by mouth every 12 (twelve) hours. 09/17/14   Charm Rings, MD  Phenylephrine-DM-GG-APAP (TYLENOL COLD/FLU SEVERE) 5-10-200-325 MG TABS Take by mouth.    [provider]  sucralfate (CARAFATE) 1 G tablet Take 1 tablet (1 g total) by mouth 4 (four) times daily -  with meals and at bedtime. 03/19/15   Rodolph Bong, MD  triamcinolone cream (KENALOG) 0.1 % Apply 1 application topically 3 (three) times daily. X 14 days 04/08/15   Street, [place], PA-C    Family History No family history on file.  Social History Social History   Tobacco Use  . Smoking status: Current Every Day Smoker    Packs/day: 1.00    Types: Cigarettes  . Smokeless tobacco: Never Used  Substance Use Topics  . Alcohol use: No  . Drug use: No     Allergies   Patient has no known allergies.   Review of Systems Review of Systems  Constitutional: Positive for appetite change, chills, diaphoresis and fever.  HENT: Positive for sore throat. Negative for congestion and rhinorrhea.   Eyes: Negative for visual disturbance.  Respiratory: Negative for cough  and shortness of breath.   Cardiovascular: Negative for chest pain.  Gastrointestinal: Negative for abdominal pain, constipation, diarrhea, nausea and vomiting.  Genitourinary: Negative for dysuria.  Musculoskeletal: Positive for myalgias.  Skin: Negative for rash.  Neurological: Positive for headaches.     Physical Exam Updated Vital Signs BP 129/71   Pulse 81   Temp 99.4 F (37.4 C) (Oral)   Resp 18   Ht 5\' 10"  (1.778 m)   Wt 72.6 kg   SpO2 100%   BMI 22.96 kg/m   Physical Exam Vitals signs and nursing note reviewed.  Constitutional:      Appearance: He is well-developed.  HENT:     Head: Normocephalic and  atraumatic.     Right Ear: Tympanic membrane normal.     Left Ear: Tympanic membrane normal.     Nose: No congestion or rhinorrhea.     Mouth/Throat:     Mouth: Mucous membranes are moist.     Pharynx: Oropharyngeal exudate and posterior oropharyngeal erythema present.     Comments: Tonsillar edema present, R>L. Tonsillar exudate noted. No uvular deviation, muffled voice, or trismus.  Eyes:     Conjunctiva/sclera: Conjunctivae normal.  Neck:     Musculoskeletal: Neck supple.  Cardiovascular:     Rate and Rhythm: Normal rate and regular rhythm.     Heart sounds: Normal heart sounds. No murmur.  Pulmonary:     Effort: Pulmonary effort is normal. No respiratory distress.     Breath sounds: Normal breath sounds. No wheezing or rhonchi.  Abdominal:     Palpations: Abdomen is soft.     Tenderness: There is no abdominal tenderness.  Lymphadenopathy:     Cervical: Cervical adenopathy present.  Skin:    General: Skin is warm and dry.  Neurological:     Mental Status: He is alert.  Psychiatric:        Mood and Affect: Mood normal.     ED Treatments / Results  Labs (all labs ordered are listed, but only abnormal results are displayed) Labs Reviewed  COMPREHENSIVE METABOLIC PANEL - Abnormal; Notable for the following components:      Result Value   Glucose, Bld 123 (*)    Calcium 8.8 (*)    AST 42 (*)    All other components within normal limits  LACTIC ACID, PLASMA  LACTIC ACID, PLASMA  CBC WITH DIFFERENTIAL/PLATELET    EKG None  Radiology Dg Chest 2 View  Result Date: 12/01/2018 CLINICAL DATA:  Fever for the past 5 days. EXAM: CHEST - 2 VIEW COMPARISON:  07/30/2015 FINDINGS: The heart size and mediastinal contours are within normal limits. Both lungs are clear. The visualized skeletal structures are unremarkable. IMPRESSION: No active cardiopulmonary disease. Electronically Signed   By: Tollie Eth M.D.   On: 12/01/2018 01:45    Procedures Procedures (including critical  care time)  Medications Ordered in ED Medications  acetaminophen (TYLENOL) tablet 650 mg (650 mg Oral Given 12/01/18 0112)  sodium chloride flush (NS) 0.9 % injection 3 mL (3 mLs Intravenous Given 12/01/18 0713)  dexamethasone (DECADRON) injection 10 mg (10 mg Intravenous Given 12/01/18 0711)  sodium chloride 0.9 % bolus 500 mL (0 mLs Intravenous Stopped 12/01/18 0742)  ibuprofen (ADVIL,MOTRIN) tablet 600 mg (600 mg Oral Given 12/01/18 0712)  Ampicillin-Sulbactam (UNASYN) 3 g in sodium chloride 0.9 % 100 mL IVPB (3 g Intravenous New Bag/Given 12/01/18 0744)     Initial Impression / Assessment and Plan / ED Course  I have  reviewed the triage vital signs and the nursing notes.  Pertinent labs & imaging results that were available during my care of the patient were reviewed by me and considered in my medical decision making (see chart for details).     Final Clinical Impressions(s) / ED Diagnoses   Final diagnoses:  Pharyngitis, unspecified etiology   Pt presenting with continued sore throat ongoing for 5 days. [facility] and had negative strep however was started on augmentin due to his fevers. He had no evidence of PTA at that time and was told to monitor sxs and return to the ER if fevers or sxs persisted past 24-48 hours. He presents initially with fevers today. He is not tachycardic and his vitals are otherwise stable.   On exam does have tonsillar edema with exudates bilaterally. Right tonsil swollen > left. No uvular deviation, muffled voice, or trismus.   Labs drawn in triage including CBC, CMP. CBC notably without leukocytosis.  CMP is reassuring.  Lactic acid is negative. Does not meet sepsis criteria at this time. CXR ordered from triage is negative.  Patient given IV antibiotics, fluids, Decadron and ibuprofen in the ED.  On reassessment tonsillar edema has improved and patient states he has been able to drink water since receiving medications.  He also states that his  pain has improved.  He is in no distress.  Continues to have no drooling, trismus or muffled voice.  Have advised him to continue Augmentin at home and to treat fevers with Tylenol and Motrin. He is Also requesting additional short course of pain medications.  Will give short course of Norco.  [facility] reviewed and there are no red flags.  We will also give referral to ENT.  Advised him to contact the office tomorrow to schedule an appointment for follow-up.  Advised him to return to the ER for new or worsening symptoms.  He voices understanding of the plan and reasons to return.  All questions answered.  ED Discharge Orders         Ordered    HYDROcodone-acetaminophen (NORCO/VICODIN) 5-325 MG tablet  Every 8 hours PRN     12/01/18 0822           Karrie MeresCouture, Roselynn Whitacre S, PA-C 12/01/18 [id number]    Derwood KaplanNanavati, Ankit, MD 12/01/18 [phone]

## 2018-12-02 LAB — CULTURE, GROUP A STREP (THRC)

## 2018-12-03 DIAGNOSIS — J039 Acute tonsillitis, unspecified: Secondary | ICD-10-CM | POA: Diagnosis not present

## 2019-10-01 IMAGING — DX DG CHEST 2V
2 series · 2 of 2 positions shown · non-contrast
Comparison: 07/30/2015

CLINICAL DATA: Fever for the past 5 days.

EXAM:
CHEST - 2 VIEW

[chest pa]
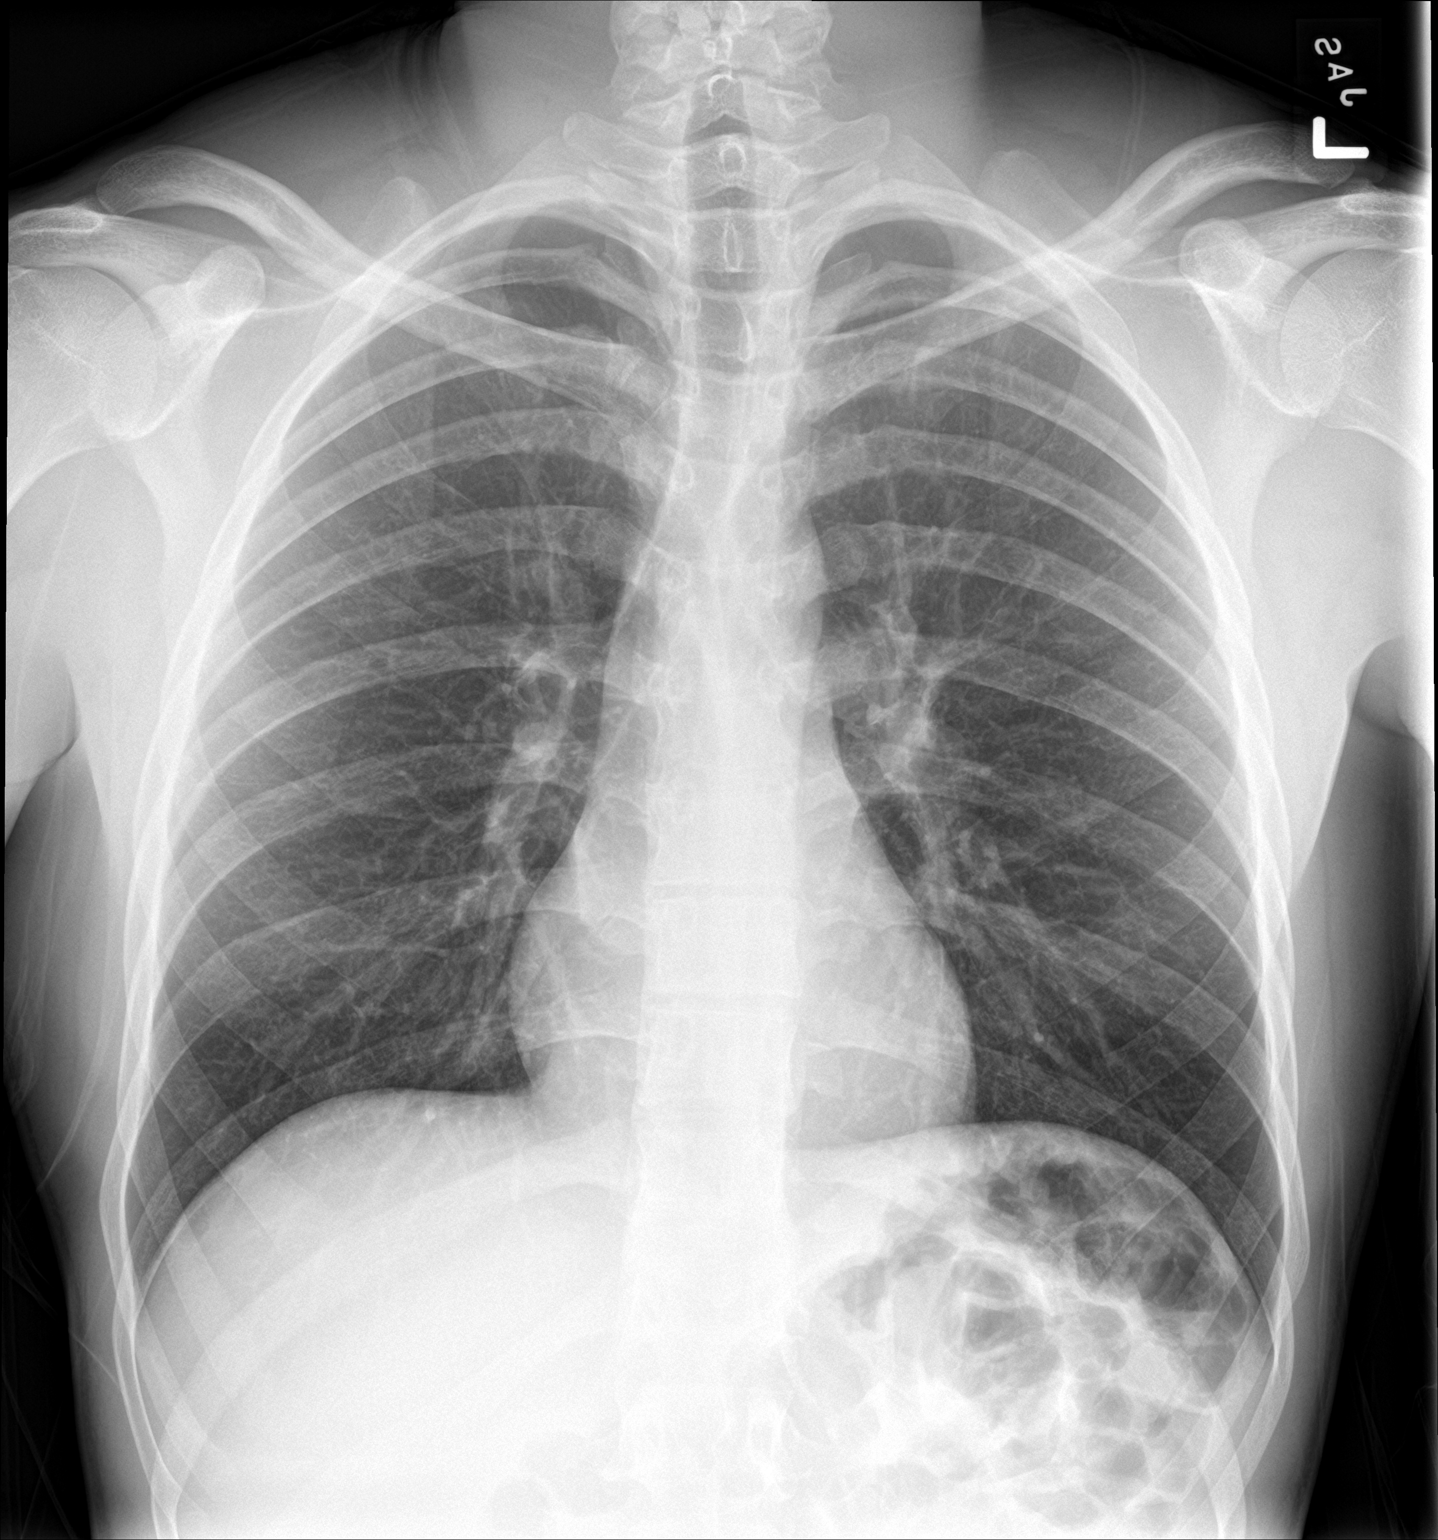

[chest lat]
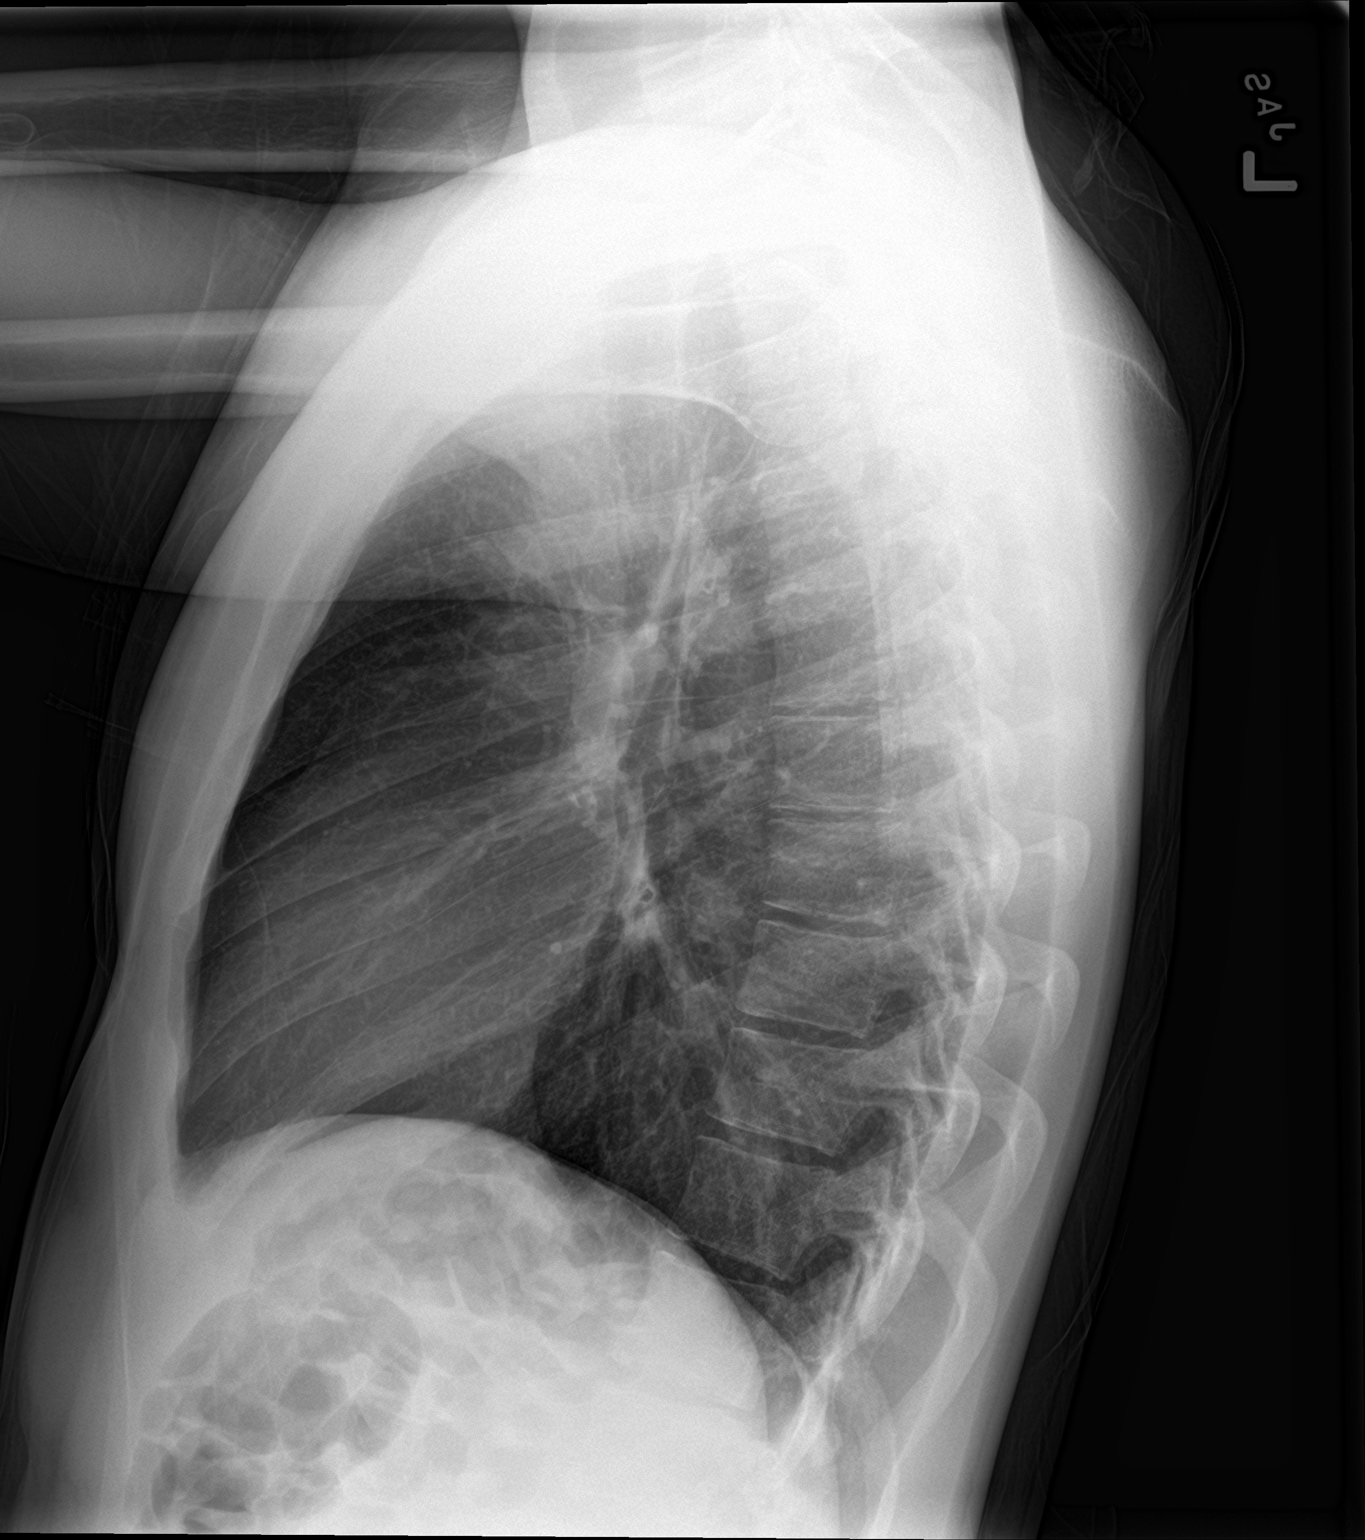

[2 of 2 positions shown; findings below may reference images not displayed]

FINDINGS: The heart size and mediastinal contours are within normal limits.
Both lungs are clear. The visualized skeletal structures are
unremarkable.
IMPRESSION: No active cardiopulmonary disease.

## 2021-05-15 ENCOUNTER — Encounter (HOSPITAL_COMMUNITY): Payer: Self-pay | Admitting: Emergency Medicine

## 2021-05-15 ENCOUNTER — Encounter (HOSPITAL_COMMUNITY): Payer: Self-pay | Admitting: *Deleted

## 2021-05-15 ENCOUNTER — Ambulatory Visit (HOSPITAL_COMMUNITY)
Admission: EM | Admit: 2021-05-15 | Discharge: 2021-05-15 | Disposition: A | Discharge status: Other Acute Inpatient Hospital | Payer: BLUE CROSS/BLUE SHIELD

## 2021-05-15 ENCOUNTER — Other Ambulatory Visit: Payer: Self-pay

## 2021-05-15 ENCOUNTER — Emergency Department (HOSPITAL_COMMUNITY)
Admission: EM | Admit: 2021-05-15 | Discharge: 2021-05-15 | Disposition: A | Discharge status: Home / Self Care | Payer: BLUE CROSS/BLUE SHIELD | Attending: Emergency Medicine | Admitting: Emergency Medicine

## 2021-05-15 DIAGNOSIS — T63441A Toxic effect of venom of bees, accidental (unintentional), initial encounter: Secondary | ICD-10-CM | POA: Insufficient documentation

## 2021-05-15 DIAGNOSIS — X58XXXA Exposure to other specified factors, initial encounter: Secondary | ICD-10-CM | POA: Insufficient documentation

## 2021-05-15 DIAGNOSIS — F17210 Nicotine dependence, cigarettes, uncomplicated: Secondary | ICD-10-CM | POA: Insufficient documentation

## 2021-05-15 DIAGNOSIS — T782XXA Anaphylactic shock, unspecified, initial encounter: Secondary | ICD-10-CM

## 2021-05-15 MED ORDER — PREDNISONE 20 MG PO TABS: 40.0000 mg | ORAL_TABLET | Freq: Every day | ORAL | 0 refills | Status: AC

## 2021-05-15 MED ORDER — DIPHENHYDRAMINE HCL 25 MG PO TABS: 25.0000 mg | ORAL_TABLET | Freq: Four times a day (QID) | ORAL | 0 refills | Status: DC | PRN

## 2021-05-15 MED ORDER — DIPHENHYDRAMINE HCL 25 MG PO CAPS
50.0000 mg | ORAL_CAPSULE | Freq: Once | ORAL | Status: AC
  Administered 2021-05-15: 50 mg via ORAL
  Filled 2021-05-15: qty 2

## 2021-05-15 MED ORDER — DIPHENHYDRAMINE HCL 25 MG PO TABS: 25.0000 mg | ORAL_TABLET | Freq: Four times a day (QID) | ORAL | 0 refills | Status: AC | PRN

## 2021-05-15 MED ORDER — FAMOTIDINE 20 MG PO TABS: 20.0000 mg | ORAL_TABLET | Freq: Two times a day (BID) | ORAL | 0 refills | Status: DC

## 2021-05-15 MED ORDER — EPINEPHRINE 0.3 MG/0.3ML IJ SOAJ: 0.3000 mg | INTRAMUSCULAR | 0 refills | Status: AC | PRN

## 2021-05-15 MED ORDER — FAMOTIDINE 20 MG PO TABS: 20.0000 mg | ORAL_TABLET | Freq: Two times a day (BID) | ORAL | 0 refills | Status: AC

## 2021-05-15 MED ORDER — PREDNISONE 20 MG PO TABS
60.0000 mg | ORAL_TABLET | Freq: Once | ORAL | Status: AC
  Administered 2021-05-15: 60 mg via ORAL
  Filled 2021-05-15: qty 3

## 2021-05-15 MED ORDER — PREDNISONE 20 MG PO TABS: 40.0000 mg | ORAL_TABLET | Freq: Every day | ORAL | 0 refills | Status: DC

## 2021-05-15 MED ORDER — FAMOTIDINE 20 MG PO TABS
20.0000 mg | ORAL_TABLET | Freq: Once | ORAL | Status: AC
  Administered 2021-05-15: 20 mg via ORAL
  Filled 2021-05-15: qty 1

## 2021-05-15 MED ORDER — EPINEPHRINE 0.3 MG/0.3ML IJ SOAJ: 0.3000 mg | INTRAMUSCULAR | 0 refills | Status: DC | PRN

## 2021-05-15 NOTE — ED Triage Notes (Signed)
Pt reports bee sting to R foot around 4pm.  States he felt like throat was closing and tongue was swelling with difficulty breathing.  Used EPI pen at approx 4:15pm.  States he only has R foot pain at this time. Mild hives noted to face.  Speaking in complete sentences.

## 2021-05-15 NOTE — ED Notes (Signed)
Med hgiven

## 2021-05-15 NOTE — ED Notes (Signed)
The pt was stung by a yellow jacket around 1545 he  Gave himself with an epi pen  He initially had a rash but at present he has none he feels ok  Rt foot stung  Small red bite mark no itching  He went to 2 urgent care neither would treat him  And he wound up here 3rd choice  No problems at present

## 2021-05-15 NOTE — ED Notes (Signed)
On assessment of the lateral right foot, Skin is smooth, but patient reports "sticking" pain with light palpation of the area. Patient was encouraged to  Soak foot in Episom salt when he gets home.

## 2021-05-15 NOTE — Discharge Instructions (Signed)
Please read and follow all provided instructions.  Your diagnoses today include:  1. Anaphylaxis, initial encounter     Tests performed today include: Vital signs. See below for your results today.   Medications prescribed:  Prednisone - steroid medicine   It is best to take this medication in the morning to prevent sleeping problems. If you are diabetic, monitor your blood sugar closely and stop taking Prednisone if blood sugar is over 300. Take with food to prevent stomach upset.   Benadryl (diphenhydramine) - antihistamine  You can find this medication over-the-counter.   DO NOT exceed:  50mg  Benadryl every 6 hours    Benadryl will make you drowsy. DO NOT drive or perform any activities that require you to be awake and alert if taking this.  Pepcid (famotidine) - antihistamine  You can find this medication over-the-counter.   DO NOT exceed:  20mg  Pepcid every 12 hours  Epi-pen Inject into thigh as directed if you have a severe reaction that causes throat swelling or any trouble breathing. Call 9-1-1 immediately if you use an Epi-pen. You should be evaluated at a hospital as soon as possible.    Take any prescribed medications only as directed.  Home care instructions:  Follow any educational materials contained in this packet  Follow-up instructions: Please follow-up with your primary care provider in the next 3 days for further evaluation of your symptoms if not improved.    Return instructions:  Please return to the Emergency Department if you experience worsening symptoms.  Call 9-1-1 immediately if you have an allergic reaction that involves your lips, mouth, throat or if you have any difficulty breathing. This is a life-threatening emergency.  Please return if you have any other emergent concerns.  Additional Information:  Your vital signs today were: BP 115/61   Pulse (!) 56   Temp 98.9 F (37.2 C)   Resp 16   SpO2 98%  If your blood pressure (BP) was  elevated above 135/85 this visit, please have this repeated by your doctor within one month. --------------

## 2021-05-15 NOTE — ED Notes (Signed)
Patient ambulated to and from bathroom with limping gait, but is steady. Patient reports he has a stinger in his foot.

## 2021-05-15 NOTE — ED Triage Notes (Signed)
Pt stung by bee today. Pt used his epi -pen. HR 91

## 2021-05-15 NOTE — ED Notes (Signed)
Patient left ED ambulatory with steady independent gait. Patient is A/O at time of DC. 

## 2021-05-15 NOTE — ED Provider Notes (Signed)
[facility] EMERGENCY DEPARTMENT Provider Note   CSN: [id number] Arrival date & time: 05/15/21  1744     History Chief Complaint  Patient presents with   Allergic Reaction   Insect Bite    Cory Huynh is a 25 y.o. male.  Patient presents the emergency department for evaluation of anaphylactic reaction after bee sting.  Patient was stung on the right foot.  He states that he began to feel lightheaded and nearly passed out a couple of times.  He had a sensation of throat closing as well as some mild swelling of the lips and tongue.  He has his EpiPen at approximately 4:15 PM.  This helped his symptoms instantly.  No nausea or vomiting.  He has had allergic reactions to bee stings in the past.  No other medications prior to arrival.      History reviewed. No pertinent past medical history.  There are no problems to display for this patient.   History reviewed. No pertinent surgical history.     No family history on file.  Social History   Tobacco Use   Smoking status: Every Day    Packs/day: 1.00    Pack years: 0.00    Types: Cigarettes   Smokeless tobacco: Never  Vaping Use   Vaping Use: Never used  Substance Use Topics   Alcohol use: Yes   Drug use: Yes    Types: Marijuana    Home Medications Prior to Admission medications   Medication Sig Start Date End Date Taking? Authorizing Provider  amoxicillin-clavulanate (AUGMENTIN) 875-125 MG tablet Take 1 tablet by mouth every 12 (twelve) hours. Patient not taking: Reported on 12/01/2018 11/29/18   Mardella Layman, MD  benzonatate (TESSALON) 100 MG capsule Take 1 capsule (100 mg total) by mouth 2 (two) times daily as needed for cough. Patient not taking: Reported on 12/01/2018 09/17/14   Charm Rings, MD  diphenhydrAMINE (BENADRYL) 25 mg capsule Take 1 capsule (25 mg total) by mouth every 6 (six) hours as needed. Patient not taking: Reported on 12/01/2018 07/31/17   Maxwell Caul, PA-C   famotidine (PEPCID) 40 MG tablet Take 1 tablet (40 mg total) by mouth daily. Patient not taking: Reported on 12/01/2018 07/31/17 08/05/17  Maxwell Caul, PA-C  HYDROcodone-acetaminophen (NORCO/VICODIN) 5-325 MG tablet Take 1 tablet by mouth every 8 (eight) hours as needed. 12/01/18   Couture, Cortni S, PA-C  ibuprofen (ADVIL,MOTRIN) 200 MG tablet Take 400 mg by mouth every 6 (six) hours as needed for fever.    [provider]  omeprazole (PRILOSEC) 40 MG capsule Take 1 capsule (40 mg total) by mouth daily. Patient not taking: Reported on 12/01/2018 03/19/15   Rodolph Bong, MD  oseltamivir (TAMIFLU) 75 MG capsule Take 1 capsule (75 mg total) by mouth every 12 (twelve) hours. Patient not taking: Reported on 12/01/2018 09/17/14   Charm Rings, MD  sucralfate (CARAFATE) 1 G tablet Take 1 tablet (1 g total) by mouth 4 (four) times daily -  with meals and at bedtime. Patient not taking: Reported on 12/01/2018 03/19/15   Rodolph Bong, MD  triamcinolone cream (KENALOG) 0.1 % Apply 1 application topically 3 (three) times daily. X 14 days Patient not taking: Reported on 12/01/2018 04/08/15   Street, [place], PA-C    Allergies    Bee venom and Beef-derived products  Review of Systems   Review of Systems  Constitutional:  Negative for fever.  HENT:  Positive for facial swelling  and trouble swallowing.   Eyes:  Negative for redness.  Respiratory:  Positive for shortness of breath. Negative for wheezing and stridor.   Cardiovascular:  Negative for chest pain.  Gastrointestinal:  Negative for nausea and vomiting.  Musculoskeletal:  Negative for myalgias.  Skin:  Negative for rash.  Neurological:  Positive for light-headedness. Negative for syncope.  Psychiatric/Behavioral:  Negative for confusion.    Physical Exam Updated Vital Signs BP 132/68   Pulse 72   Temp 98.9 F (37.2 C)   Resp 18   SpO2 99%   Physical Exam Vitals and nursing note reviewed.  Constitutional:      General: He  is not in acute distress.    Appearance: He is well-developed.  HENT:     Head: Normocephalic and atraumatic.     Mouth/Throat:     Comments: No angioedema.  Oropharynx is clear. Eyes:     General:        Right eye: No discharge.        Left eye: No discharge.     Conjunctiva/sclera: Conjunctivae normal.  Cardiovascular:     Rate and Rhythm: Normal rate and regular rhythm.     Heart sounds: Normal heart sounds.  Pulmonary:     Effort: Pulmonary effort is normal.     Breath sounds: Normal breath sounds.  Abdominal:     Palpations: Abdomen is soft.     Tenderness: There is no abdominal tenderness.  Musculoskeletal:     Cervical back: Normal range of motion and neck supple.     Comments: Mild swelling over the lateral right foot, no stinger noted.  Skin:    General: Skin is warm and dry.  Neurological:     Mental Status: He is alert.    ED Results / Procedures / Treatments   Labs (all labs ordered are listed, but only abnormal results are displayed) Labs Reviewed - No data to display  EKG None  Radiology No results found.  Procedures Procedures   Medications Ordered in ED Medications  predniSONE (DELTASONE) tablet 60 mg (has no administration in time range)  diphenhydrAMINE (BENADRYL) capsule 50 mg (has no administration in time range)  famotidine (PEPCID) tablet 20 mg (has no administration in time range)    ED Course  I have reviewed the triage vital signs and the nursing notes.  Pertinent labs & imaging results that were available during my care of the patient were reviewed by me and considered in my medical decision making (see chart for details).  Patient seen and examined.  Patient with resolving anaphylactic reaction.  Will need to be monitored until approximately 8:15 PM.  Prednisone, Benadryl, Pepcid ordered.  Vital signs reviewed and are as follows: BP 132/68   Pulse 72   Temp 98.9 F (37.2 C)   Resp 18   SpO2 99%   Patient stable 4 hours out  from EpiPen administration.  He is back to baseline.  He is requesting discharge to home.  Prescription given for prednisone, Benadryl, Pepcid, EpiPen.  Encourage PCP follow-up as needed.      MDM Rules/Calculators/A&P                          Patient with anaphylactic reaction after being stung by a bee.  He is here for the same.  He treated himself with epi prior to ED arrival and completed observation without any worsening or signs of rebound.  Plan as above.  Final Clinical Impression(s) / ED Diagnoses Final diagnoses:  Anaphylaxis, initial encounter    Rx / DC Orders ED Discharge Orders          Ordered    predniSONE (DELTASONE) 20 MG tablet  Daily,   Status:  Discontinued        05/15/21 2007    diphenhydrAMINE (BENADRYL) 25 MG tablet  Every 6 hours PRN,   Status:  Discontinued        05/15/21 2007    famotidine (PEPCID) 20 MG tablet  2 times daily,   Status:  Discontinued        05/15/21 2007    EPINEPHrine 0.3 mg/0.3 mL IJ SOAJ injection  As needed,   Status:  Discontinued        05/15/21 2007    diphenhydrAMINE (BENADRYL) 25 MG tablet  Every 6 hours PRN        05/15/21 2029    EPINEPHrine 0.3 mg/0.3 mL IJ SOAJ injection  As needed        05/15/21 2029    famotidine (PEPCID) 20 MG tablet  2 times daily        05/15/21 2029    predniSONE (DELTASONE) 20 MG tablet  Daily        05/15/21 2029             Renne Crigler, PA-C 05/15/21 2043    Mancel Bale, MD 05/15/21 2046

## 2021-08-04 ENCOUNTER — Other Ambulatory Visit: Payer: Self-pay

## 2021-08-04 ENCOUNTER — Ambulatory Visit: Payer: Commercial Managed Care - PPO | Attending: Obstetrics and Gynecology

## 2021-08-04 DIAGNOSIS — Z1371 Encounter for nonprocreative screening for genetic disease carrier status: Secondary | ICD-10-CM

## 2021-08-04 NOTE — Progress Notes (Signed)
Partner of Guadlupe Spanish DOB 1996-06-25. 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

## 2022-08-31 ENCOUNTER — Emergency Department (HOSPITAL_COMMUNITY)
Admission: EM | Admit: 2022-08-31 | Discharge: 2022-09-01 | Discharge status: Against Medical Advice | Payer: BC Managed Care – PPO | Attending: Emergency Medicine | Admitting: Emergency Medicine

## 2022-08-31 DIAGNOSIS — M5441 Lumbago with sciatica, right side: Secondary | ICD-10-CM | POA: Insufficient documentation

## 2022-08-31 DIAGNOSIS — M545 Low back pain, unspecified: Secondary | ICD-10-CM | POA: Insufficient documentation

## 2022-08-31 DIAGNOSIS — Z5321 Procedure and treatment not carried out due to patient leaving prior to being seen by health care provider: Secondary | ICD-10-CM | POA: Diagnosis not present

## 2022-08-31 DIAGNOSIS — X500XXA Overexertion from strenuous movement or load, initial encounter: Secondary | ICD-10-CM | POA: Diagnosis not present

## 2022-08-31 DIAGNOSIS — M79604 Pain in right leg: Secondary | ICD-10-CM | POA: Diagnosis not present

## 2022-08-31 LAB — BASIC METABOLIC PANEL
Anion gap: 7 (ref 5–15)
BUN: 9 mg/dL (ref 6–20)
CO2: 25 mmol/L (ref 22–32)
Calcium: 9 mg/dL (ref 8.9–10.3)
Chloride: 105 mmol/L (ref 98–111)
Creatinine, Ser: 1 mg/dL (ref 0.61–1.24)
GFR, Estimated: >60 mL/min (ref >60)
Glucose, Bld: 97 mg/dL (ref 70–99)
Potassium: 4.5 mmol/L (ref 3.5–5.1)
Sodium: 137 mmol/L (ref 135–145)

## 2022-08-31 LAB — CBC
HCT: 41.8 % (ref 39.0–52.0)
Hemoglobin: 13.8 g/dL (ref 13.0–17.0)
MCH: 27.8 pg (ref 26.0–34.0)
MCHC: 33 g/dL (ref 30.0–36.0)
MCV: 84.1 fL (ref 80.0–100.0)
Platelets: 291 10*3/uL (ref 150–400)
RBC: 4.97 MIL/uL (ref 4.22–5.81)
RDW: 12.8 % (ref 11.5–15.5)
WBC: 9.4 10*3/uL (ref 4.0–10.5)
nRBC: 0 % (ref 0.0–0.2)

## 2022-08-31 NOTE — ED Triage Notes (Signed)
Pt c/o sacral pain x1wk, associated leg, low back pain. Denies known injury, denies urinary/ bowel incontenence
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
## 2022-09-01 ENCOUNTER — Encounter (HOSPITAL_BASED_OUTPATIENT_CLINIC_OR_DEPARTMENT_OTHER): Payer: Self-pay

## 2022-09-01 ENCOUNTER — Emergency Department (HOSPITAL_BASED_OUTPATIENT_CLINIC_OR_DEPARTMENT_OTHER)
Admission: EM | Admit: 2022-09-01 | Discharge: 2022-09-01 | Disposition: A | Discharge status: Home / Self Care | Payer: BC Managed Care – PPO | Source: Home / Self Care | Attending: Emergency Medicine | Admitting: Emergency Medicine

## 2022-09-01 ENCOUNTER — Other Ambulatory Visit (HOSPITAL_BASED_OUTPATIENT_CLINIC_OR_DEPARTMENT_OTHER): Payer: Self-pay

## 2022-09-01 ENCOUNTER — Other Ambulatory Visit: Payer: Self-pay

## 2022-09-01 DIAGNOSIS — M5441 Lumbago with sciatica, right side: Secondary | ICD-10-CM | POA: Insufficient documentation

## 2022-09-01 DIAGNOSIS — X500XXA Overexertion from strenuous movement or load, initial encounter: Secondary | ICD-10-CM | POA: Insufficient documentation

## 2022-09-01 MED ORDER — ACETAMINOPHEN 500 MG PO TABS
1000.0000 mg | ORAL_TABLET | Freq: Once | ORAL | Status: AC
  Administered 2022-09-01: 1000 mg via ORAL
  Filled 2022-09-01: qty 2

## 2022-09-01 MED ORDER — LIDOCAINE 5 % EX PTCH
1.0000 | MEDICATED_PATCH | Freq: Once | CUTANEOUS | Status: DC
  Administered 2022-09-01: 1 via TRANSDERMAL
  Filled 2022-09-01: qty 3

## 2022-09-01 MED ORDER — IBUPROFEN 400 MG PO TABS
600.0000 mg | ORAL_TABLET | Freq: Once | ORAL | Status: AC
  Administered 2022-09-01: 600 mg via ORAL
  Filled 2022-09-01: qty 1

## 2022-09-01 MED ORDER — LIDOCAINE 5 % EX PTCH
1.0000 | MEDICATED_PATCH | CUTANEOUS | 0 refills | Status: AC
  Filled 2022-09-01: qty 30, 30d supply, fill #0

## 2022-09-01 NOTE — Discharge Instructions (Addendum)
You were seen in the emergency department for your back pain.  You had no signs of any broken bones or injury to your nerves.  You likely have a pulled muscle causing your pain or sciatica.  You can take Tylenol and Motrin as needed for pain.  Both can be taken up to every 6 hours.  You can also use lidocaine patches.  These can stay on for 12 hours at a time and you can try heat in between.  You should try to stretch her back and limit the amount of heavy lifting or straining on your back for the next few days so that it can heal.  You should follow-up with your primary doctor to have your symptoms rechecked and to determine if you need any further treatment such as physical therapy.  You should return to the emergency department if your pains getting significantly worse, you have numbness and weakness in your legs, you are unable to walk, you are unable to urinate, you have fevers or if you have any other new or concerning symptoms.

## 2022-09-01 NOTE — ED Notes (Signed)
Discharge instructions discussed with pt. Pt verbalized understanding. Pt stable and ambulatory.  °
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
## 2022-09-01 NOTE — ED Notes (Signed)
PATIENT HAS BEEN CALLED SINCE 2130 BY CT TECHS NO ANSWER Called patient FOR VITALSIGNS  NO ANSWER X4
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
## 2022-09-02 ENCOUNTER — Other Ambulatory Visit (HOSPITAL_BASED_OUTPATIENT_CLINIC_OR_DEPARTMENT_OTHER): Payer: Self-pay

## 2022-10-12 ENCOUNTER — Other Ambulatory Visit (HOSPITAL_COMMUNITY): Payer: Self-pay

## 2023-03-29 ENCOUNTER — Encounter (HOSPITAL_BASED_OUTPATIENT_CLINIC_OR_DEPARTMENT_OTHER): Payer: Self-pay

## 2023-03-29 ENCOUNTER — Emergency Department (HOSPITAL_BASED_OUTPATIENT_CLINIC_OR_DEPARTMENT_OTHER)
Admission: EM | Admit: 2023-03-29 | Discharge: 2023-03-29 | Disposition: A | Discharge status: Home / Self Care | Payer: Medicaid Other | Attending: Emergency Medicine | Admitting: Emergency Medicine

## 2023-03-29 ENCOUNTER — Other Ambulatory Visit: Payer: Self-pay

## 2023-03-29 DIAGNOSIS — H1031 Unspecified acute conjunctivitis, right eye: Secondary | ICD-10-CM | POA: Insufficient documentation

## 2023-03-29 DIAGNOSIS — H5711 Ocular pain, right eye: Secondary | ICD-10-CM | POA: Diagnosis present

## 2023-03-29 MED ORDER — POLYMYXIN B-TRIMETHOPRIM 10000-0.1 UNIT/ML-% OP SOLN
1.0000 [drp] | OPHTHALMIC | Status: DC
  Filled 2023-03-29: qty 10

## 2023-03-29 MED ORDER — FLUORESCEIN SODIUM 1 MG OP STRP
1.0000 | ORAL_STRIP | Freq: Once | OPHTHALMIC | Status: DC
  Filled 2023-03-29: qty 1

## 2023-03-29 MED ORDER — TETRACAINE HCL 0.5 % OP SOLN
1.0000 [drp] | Freq: Once | OPHTHALMIC | Status: DC
  Filled 2023-03-29: qty 4

## 2023-03-29 NOTE — ED Notes (Signed)
Pt verbalized understanding of d/c instructions, meds, and followup care. Denies questions. VSS, no distress noted. Steady gait to exit with all belongings.  ?

## 2023-03-29 NOTE — Discharge Instructions (Signed)
Use the eye drop 4 times a day while awake.  If moves to the other eye you can put the drops in that.  This can be very contagious so make sure nobody in your house is using his same towel or washcloth.

## 2023-03-29 NOTE — ED Triage Notes (Signed)
Patient arrives POV with fiance c/o right eye pain. 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 Patient's right eye is watering with purulent drainage. Patient is unable to open right eyelid.

## 2023-03-29 NOTE — ED Provider Notes (Signed)
EMERGENCY DEPARTMENT AT [facility] Provider Note   CSN: [id number] Arrival date & time: 03/29/23  1938     History  Chief Complaint  Patient presents with   Foreign Body in Eye    Cory Huynh is a 27 y.o. male.  Patient is a healthy 27 year old male presenting today due to pain, drainage and erythema of his right eye.  Patient's reports that symptoms started yesterday.  They started after he finished up at work.  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  He had no pain in his side during work does not recall anything getting into his eye it was after he finished work and he was washing up and he started noticing some discomfort in his right eye.  When he woke up this morning it was matted shut and has been draining, tearing and red and has a scratchy sensation.  He does not wear contacts and denies any visual changes.  He has not been putting any drops in his eyes and has no known allergic causes  The history is provided by the patient.  Foreign Body in Eye       Home Medications Prior to Admission medications   Medication Sig Start Date End Date Taking? Authorizing Provider  diphenhydrAMINE (BENADRYL) 25 MG tablet Take 1 tablet (25 mg total) by mouth every 6 (six) hours as needed for up to 5 days. 05/15/21 05/20/21  Renne Crigler, PA-C  EPINEPHrine 0.3 mg/0.3 mL IJ SOAJ injection Inject 0.3 mg into the muscle as needed for anaphylaxis. 05/15/21   Renne Crigler, PA-C  famotidine (PEPCID) 20 MG tablet Take 1 tablet (20 mg total) by mouth 2 (two) times daily for 5 days. 05/15/21 05/20/21  Renne Crigler, PA-C  HYDROcodone-acetaminophen (NORCO/VICODIN) 5-325 MG tablet Take 1 tablet by mouth every 8 (eight) hours as needed. 12/01/18   Couture, Cortni S, PA-C  ibuprofen (ADVIL,MOTRIN) 200 MG tablet Take 400 mg by mouth every 6 (six) hours as needed for fever.    [provider]   lidocaine (LIDODERM) 5 % Place 1 patch onto the skin daily. Remove & Discard patch within 12 hours or as directed by MD 09/01/22   Elayne Snare K, DO  predniSONE (DELTASONE) 20 MG tablet Take 2 tablets (40 mg total) by mouth daily. 05/15/21   Renne Crigler, PA-C      Allergies    Bee venom and Beef-derived products    Review of Systems   Review of Systems  Physical Exam Updated Vital Signs BP (!) 142/88 (BP Location: Right Arm)   Pulse 82   Temp 98.2 F (36.8 C) (Oral)   Resp 18   Ht 5\' 10"  (1.778 m)   Wt 77.1 kg   SpO2 97%   BMI 24.39 kg/m  Physical Exam Vitals and nursing note reviewed.  HENT:     Head: Normocephalic.  Eyes:     General: Lids are everted, no foreign bodies appreciated. Vision grossly intact.     Extraocular Movements: Extraocular movements intact.     Conjunctiva/sclera:     Right eye: Right conjunctiva is injected. Exudate present.     Pupils: Pupils are equal, round, and reactive to light.     Right eye: No corneal abrasion or fluorescein uptake.     Slit lamp exam:    Right eye: No corneal ulcer.     Comments: Minimal upper lid  swelling  Neurological:     Mental Status: He is alert. Mental status is at baseline.     ED Results / Procedures / Treatments   Labs (all labs ordered are listed, but only abnormal results are displayed) Labs Reviewed - No data to display  EKG None  Radiology No results found.  Procedures Procedures    Medications Ordered in ED Medications  tetracaine (PONTOCAINE) 0.5 % ophthalmic solution 1 drop (has no administration in time range)  fluorescein ophthalmic strip 1 strip (has no administration in time range)  trimethoprim-polymyxin b (POLYTRIM) ophthalmic solution 1 drop (has no administration in time range)    ED Course/ Medical Decision Making/ A&P                             Medical Decision Making Risk Prescription drug management.   Patient presenting today with what appears to be  conjunctivitis.  Bacterial versus allergic.  Low suspicion for foreign body as patient's lids were everted no foreign body was seen and patient wears 2 protective goggles and a shield at work and does not recall anything getting into his eye or any pain while he was at work.  He does not wear contacts there is no evidence of corneal abrasion or ulcer.  His vision is intact.  Patient given antibiotic drops and follow-up with ophthalmology as needed.        Final Clinical Impression(s) / ED Diagnoses Final diagnoses:  Acute conjunctivitis of right eye, unspecified acute conjunctivitis type    Rx / DC Orders ED Discharge Orders     None         Gwyneth Sprout, MD 03/29/23 2200

## 2023-05-25 ENCOUNTER — Ambulatory Visit
Admission: EM | Admit: 2023-05-25 | Discharge: 2023-05-25 | Disposition: A | Discharge status: Home / Self Care | Payer: Medicaid Other | Attending: Internal Medicine | Admitting: Internal Medicine

## 2023-05-25 DIAGNOSIS — K611 Rectal abscess: Secondary | ICD-10-CM | POA: Diagnosis not present

## 2023-05-25 MED ORDER — SULFAMETHOXAZOLE-TRIMETHOPRIM 800-160 MG PO TABS: 1.0000 | ORAL_TABLET | Freq: Two times a day (BID) | ORAL | 0 refills | Status: AC

## 2023-05-25 MED ORDER — NAPROXEN 500 MG PO TABS: 500.0000 mg | ORAL_TABLET | Freq: Two times a day (BID) | ORAL | 0 refills | Status: AC

## 2023-05-25 NOTE — ED Triage Notes (Signed)
Pt presents with an abscess near the rectum. Pt states he has had the abscess x 4 days.   Pt states it popped and has been draining.

## 2023-05-25 NOTE — Discharge Instructions (Signed)
Start Bactrim twice daily for 10 days. you may do warm compresses or sitz bath as needed.  Naproxen twice daily for pain.  Please follow-up with your PCP in 2 days for recheck.  Please go to the emergency room for any worsening symptoms.  I hope you feel better soon!

## 2023-05-25 NOTE — ED Provider Notes (Signed)
[facility]    CSN: [id number] Arrival date & time: 05/25/23  1822      History   Chief Complaint Chief Complaint  Patient presents with   Abscess    HPI Cory Huynh is a 27 y.o. male presents for an abscess.  Patient reports an abscess near his rectum over the past 4 days that began to drain today.  Reports history of abscesses in the past but denies MRSA.  No fevers or chills.  He has not used any OTC medications for symptoms.  No other concerns at this time.   Abscess   History reviewed. No pertinent past medical history.  There are no problems to display for this patient.   History reviewed. No pertinent surgical history.     Home Medications    Prior to Admission medications   Medication Sig Start Date End Date Taking? Authorizing Provider  naproxen (NAPROSYN) 500 MG tablet Take 1 tablet (500 mg total) by mouth 2 (two) times daily for 7 days. 05/25/23 06/01/23 Yes Radford Pax, NP  sulfamethoxazole-trimethoprim (BACTRIM DS) 800-160 MG tablet Take 1 tablet by mouth 2 (two) times daily for 10 days. 05/25/23 06/04/23 Yes Radford Pax, NP  diphenhydrAMINE (BENADRYL) 25 MG tablet Take 1 tablet (25 mg total) by mouth every 6 (six) hours as needed for up to 5 days. 05/15/21 05/20/21  Renne Crigler, PA-C  EPINEPHrine 0.3 mg/0.3 mL IJ SOAJ injection Inject 0.3 mg into the muscle as needed for anaphylaxis. 05/15/21   Renne Crigler, PA-C  famotidine (PEPCID) 20 MG tablet Take 1 tablet (20 mg total) by mouth 2 (two) times daily for 5 days. 05/15/21 05/20/21  Renne Crigler, PA-C  HYDROcodone-acetaminophen (NORCO/VICODIN) 5-325 MG tablet Take 1 tablet by mouth every 8 (eight) hours as needed. 12/01/18   Couture, Cortni S, PA-C  ibuprofen (ADVIL,MOTRIN) 200 MG tablet Take 400 mg by mouth every 6 (six) hours as needed for fever.    [provider]  lidocaine (LIDODERM) 5 % Place 1 patch onto the skin daily. Remove & Discard patch within 12 hours or as directed  by MD 09/01/22   Elayne Snare K, DO  predniSONE (DELTASONE) 20 MG tablet Take 2 tablets (40 mg total) by mouth daily. 05/15/21   Renne Crigler, PA-C    Family History History reviewed. No pertinent family history.  Social History Social History   Tobacco Use   Smoking status: Every Day    Current packs/day: 0.50    Types: Cigarettes   Smokeless tobacco: Never  Vaping Use   Vaping status: Never Used  Substance Use Topics   Alcohol use: Not Currently   Drug use: Yes    Frequency: 7.0 times per week    Types: Marijuana     Allergies   Bee venom and Beef-derived products   Review of Systems Review of Systems  Skin:  Positive for wound.     Physical Exam Triage Vital Signs ED Triage Vitals [05/25/23 1836]  Encounter Vitals Group     BP 119/66     Systolic BP Percentile      Diastolic BP Percentile      Pulse Rate 85     Resp 17     Temp 98.3 F (36.8 C)     Temp Source Oral     SpO2 98 %     Weight      Height      Head Circumference      Peak Flow  Pain Score 8     Pain Loc      Pain Education      Exclude from Growth Chart    No data found.  Updated Vital Signs BP 119/66 (BP Location: Right Arm)   Pulse 85   Temp 98.3 F (36.8 C) (Oral)   Resp 17   SpO2 98%   Visual Acuity Right Eye Distance:   Left Eye Distance:   Bilateral Distance:    Right Eye Near:   Left Eye Near:    Bilateral Near:     Physical Exam Vitals and nursing note reviewed. Exam conducted with a chaperone present Dot [place]anes RT).  Constitutional:      General: He is not in acute distress.    Appearance: Normal appearance. He is not ill-appearing.  HENT:     Head: Normocephalic and atraumatic.  Eyes:     Pupils: Pupils are equal, round, and reactive to light.  Cardiovascular:     Rate and Rhythm: Normal rate.  Pulmonary:     Effort: Pulmonary effort is normal.  Genitourinary:      Comments: 3 x 1 cm indurated nonfluctuant abscess to the perirectal.  Some  bleeding noted.  No erythema or warmth.  Area is very tender to palpation. Skin:    General: Skin is warm and dry.  Neurological:     General: No focal deficit present.     Mental Status: He is alert and oriented to person, place, and time.  Psychiatric:        Mood and Affect: Mood normal.        Behavior: Behavior normal.      UC Treatments / Results  Labs (all labs ordered are listed, but only abnormal results are displayed) Labs Reviewed - No data to display  EKG   Radiology No results found.  Procedures Procedures (including critical care time)  Medications Ordered in UC Medications - No data to display  Initial Impression / Assessment and Plan / UC Course  I have reviewed the triage vital signs and the nursing notes.  Pertinent labs & imaging results that were available during my care of the patient were reviewed by me and considered in my medical decision making (see chart for details).     Perirectal abscess with some mild bleeding/drainage.  Will start Bactrim.  Naproxen as needed for pain.  Warm compresses to the area as needed.  PCP follow-up if symptoms do not improve.  ER precautions reviewed and patient verbalized understanding Final Clinical Impressions(s) / UC Diagnoses   Final diagnoses:  Perirectal abscess     Discharge Instructions      Start Bactrim twice daily for 10 days. you may do warm compresses or sitz bath as needed.  Naproxen twice daily for pain.  Please follow-up with your PCP in 2 days for recheck.  Please go to the emergency room for any worsening symptoms.  I hope you feel better soon!    ED Prescriptions     Medication Sig Dispense Auth. Provider   sulfamethoxazole-trimethoprim (BACTRIM DS) 800-160 MG tablet Take 1 tablet by mouth 2 (two) times daily for 10 days. 20 tablet Radford Pax, NP   naproxen (NAPROSYN) 500 MG tablet Take 1 tablet (500 mg total) by mouth 2 (two) times daily for 7 days. 14 tablet Radford Pax, NP       PDMP not reviewed this encounter.   Radford Pax, NP 05/25/23 1909

## 2023-10-16 ENCOUNTER — Other Ambulatory Visit (HOSPITAL_COMMUNITY): Payer: Self-pay
